# Patient Record
Sex: Male | Born: 1955 | Race: White | Hispanic: No | Marital: Married | Smoking: Never smoker
Health system: Southern US, Community
[De-identification: ages and names within clinical notes are randomized; demographics above are authoritative.]

## PROBLEM LIST (undated history)

## (undated) DIAGNOSIS — I10 Essential (primary) hypertension: Secondary | ICD-10-CM

## (undated) DIAGNOSIS — K8050 Calculus of bile duct without cholangitis or cholecystitis without obstruction: Secondary | ICD-10-CM

## (undated) HISTORY — PX: FOOT FOREIGN BODY REMOVAL: SUR1116

## (undated) HISTORY — DX: Calculus of bile duct without cholangitis or cholecystitis without obstruction: K80.50

---

## 2015-09-21 ENCOUNTER — Emergency Department (HOSPITAL_COMMUNITY): Payer: 59

## 2015-09-21 ENCOUNTER — Inpatient Hospital Stay (HOSPITAL_COMMUNITY)
Admission: EM | Admit: 2015-09-21 | Discharge: 2015-09-27 | DRG: 419 | Disposition: A | Discharge status: Home / Self Care | Payer: 59 | Attending: General Surgery | Admitting: General Surgery

## 2015-09-21 ENCOUNTER — Encounter (HOSPITAL_COMMUNITY): Payer: Self-pay | Admitting: *Deleted

## 2015-09-21 DIAGNOSIS — R1011 Right upper quadrant pain: Secondary | ICD-10-CM | POA: Diagnosis present

## 2015-09-21 DIAGNOSIS — K831 Obstruction of bile duct: Secondary | ICD-10-CM | POA: Insufficient documentation

## 2015-09-21 DIAGNOSIS — K838 Other specified diseases of biliary tract: Secondary | ICD-10-CM | POA: Diagnosis not present

## 2015-09-21 DIAGNOSIS — Z6837 Body mass index (BMI) 37.0-37.9, adult: Secondary | ICD-10-CM

## 2015-09-21 DIAGNOSIS — I10 Essential (primary) hypertension: Secondary | ICD-10-CM | POA: Diagnosis present

## 2015-09-21 DIAGNOSIS — R109 Unspecified abdominal pain: Secondary | ICD-10-CM | POA: Diagnosis present

## 2015-09-21 DIAGNOSIS — R1013 Epigastric pain: Secondary | ICD-10-CM | POA: Diagnosis not present

## 2015-09-21 DIAGNOSIS — R740 Nonspecific elevation of levels of transaminase and lactic acid dehydrogenase [LDH]: Secondary | ICD-10-CM

## 2015-09-21 DIAGNOSIS — R748 Abnormal levels of other serum enzymes: Secondary | ICD-10-CM | POA: Diagnosis not present

## 2015-09-21 DIAGNOSIS — R7989 Other specified abnormal findings of blood chemistry: Secondary | ICD-10-CM | POA: Diagnosis not present

## 2015-09-21 DIAGNOSIS — K219 Gastro-esophageal reflux disease without esophagitis: Secondary | ICD-10-CM | POA: Diagnosis present

## 2015-09-21 DIAGNOSIS — E669 Obesity, unspecified: Secondary | ICD-10-CM | POA: Diagnosis present

## 2015-09-21 DIAGNOSIS — Z8249 Family history of ischemic heart disease and other diseases of the circulatory system: Secondary | ICD-10-CM | POA: Diagnosis not present

## 2015-09-21 DIAGNOSIS — K8067 Calculus of gallbladder and bile duct with acute and chronic cholecystitis with obstruction: Secondary | ICD-10-CM | POA: Diagnosis present

## 2015-09-21 DIAGNOSIS — K8021 Calculus of gallbladder without cholecystitis with obstruction: Secondary | ICD-10-CM | POA: Insufficient documentation

## 2015-09-21 DIAGNOSIS — R739 Hyperglycemia, unspecified: Secondary | ICD-10-CM | POA: Diagnosis present

## 2015-09-21 DIAGNOSIS — K449 Diaphragmatic hernia without obstruction or gangrene: Secondary | ICD-10-CM | POA: Diagnosis present

## 2015-09-21 DIAGNOSIS — R945 Abnormal results of liver function studies: Secondary | ICD-10-CM

## 2015-09-21 DIAGNOSIS — K8050 Calculus of bile duct without cholangitis or cholecystitis without obstruction: Secondary | ICD-10-CM

## 2015-09-21 DIAGNOSIS — I160 Hypertensive urgency: Secondary | ICD-10-CM | POA: Diagnosis present

## 2015-09-21 DIAGNOSIS — R7401 Elevation of levels of liver transaminase levels: Secondary | ICD-10-CM

## 2015-09-21 HISTORY — DX: Essential (primary) hypertension: I10

## 2015-09-21 LAB — COMPREHENSIVE METABOLIC PANEL
ALT: 1867 U/L — AB (ref 17–63)
AST: 1316 U/L — AB (ref 15–41)
Albumin: 4.5 g/dL (ref 3.5–5.0)
Alkaline Phosphatase: 111 U/L (ref 38–126)
Anion gap: 8 (ref 5–15)
BUN: 15 mg/dL (ref 6–20)
CALCIUM: 9.2 mg/dL (ref 8.9–10.3)
CHLORIDE: 101 mmol/L (ref 101–111)
CO2: 28 mmol/L (ref 22–32)
CREATININE: 0.89 mg/dL (ref 0.61–1.24)
Glucose, Bld: 111 mg/dL — ABNORMAL HIGH (ref 65–99)
POTASSIUM: 4.3 mmol/L (ref 3.5–5.1)
SODIUM: 137 mmol/L (ref 135–145)
Total Bilirubin: 3.4 mg/dL — ABNORMAL HIGH (ref 0.3–1.2)
Total Protein: 8.1 g/dL (ref 6.5–8.1)

## 2015-09-21 LAB — CBC WITH DIFFERENTIAL/PLATELET
BASOS ABS: 0.1 10*3/uL (ref 0.0–0.1)
Basophils Relative: 1 %
EOS ABS: 0.4 10*3/uL (ref 0.0–0.7)
EOS PCT: 6 %
HCT: 51.5 % (ref 39.0–52.0)
Hemoglobin: 17.2 g/dL — ABNORMAL HIGH (ref 13.0–17.0)
Lymphocytes Relative: 14 %
Lymphs Abs: 1 10*3/uL (ref 0.7–4.0)
MCH: 30.4 pg (ref 26.0–34.0)
MCHC: 33.4 g/dL (ref 30.0–36.0)
MCV: 91.2 fL (ref 78.0–100.0)
MONO ABS: 0.7 10*3/uL (ref 0.1–1.0)
Monocytes Relative: 10 %
Neutro Abs: 4.9 10*3/uL (ref 1.7–7.7)
Neutrophils Relative %: 69 %
PLATELETS: 215 10*3/uL (ref 150–400)
RBC: 5.65 MIL/uL (ref 4.22–5.81)
RDW: 12.5 % (ref 11.5–15.5)
WBC: 7.1 10*3/uL (ref 4.0–10.5)

## 2015-09-21 LAB — URINE MICROSCOPIC-ADD ON
Bacteria, UA: NONE SEEN
Squamous Epithelial / LPF: NONE SEEN
WBC, UA: NONE SEEN WBC/hpf (ref 0–5)

## 2015-09-21 LAB — URINALYSIS, ROUTINE W REFLEX MICROSCOPIC
GLUCOSE, UA: NEGATIVE mg/dL
KETONES UR: NEGATIVE mg/dL
Leukocytes, UA: NEGATIVE
Nitrite: NEGATIVE
PROTEIN: NEGATIVE mg/dL
Specific Gravity, Urine: 1.01 (ref 1.005–1.030)
pH: 7 (ref 5.0–8.0)

## 2015-09-21 LAB — ETHANOL: Alcohol, Ethyl (B): <5 mg/dL (ref <5)

## 2015-09-21 LAB — TSH: TSH: 0.778 u[IU]/mL (ref 0.350–4.500)

## 2015-09-21 LAB — ACETAMINOPHEN LEVEL: Acetaminophen (Tylenol), Serum: <10 ug/mL — ABNORMAL LOW (ref 10–30)

## 2015-09-21 LAB — LIPASE, BLOOD: LIPASE: 27 U/L (ref 11–51)

## 2015-09-21 LAB — MAGNESIUM: Magnesium: 1.9 mg/dL (ref 1.7–2.4)

## 2015-09-21 LAB — TROPONIN I

## 2015-09-21 MED ORDER — IOHEXOL 300 MG/ML  SOLN
50.0000 mL | Freq: Once | INTRAMUSCULAR | Status: AC | PRN
  Administered 2015-09-21: 50 mL via ORAL

## 2015-09-21 MED ORDER — IOHEXOL 300 MG/ML  SOLN
100.0000 mL | Freq: Once | INTRAMUSCULAR | Status: AC | PRN
  Administered 2015-09-21: 100 mL via INTRAVENOUS

## 2015-09-21 MED ORDER — INFLUENZA VAC SPLIT QUAD 0.5 ML IM SUSY
0.5000 mL | PREFILLED_SYRINGE | INTRAMUSCULAR | Status: DC
  Filled 2015-09-21: qty 0.5

## 2015-09-21 MED ORDER — ACETAMINOPHEN 500 MG PO TABS
1000.0000 mg | ORAL_TABLET | Freq: Four times a day (QID) | ORAL | Status: DC | PRN
  Administered 2015-09-22 – 2015-09-23 (×2): 1000 mg via ORAL
  Filled 2015-09-21 (×3): qty 2

## 2015-09-21 MED ORDER — MORPHINE SULFATE (PF) 2 MG/ML IV SOLN: 2.0000 mg | INTRAVENOUS | Status: DC | PRN

## 2015-09-21 MED ORDER — ONDANSETRON HCL 4 MG/2ML IJ SOLN
4.0000 mg | Freq: Four times a day (QID) | INTRAMUSCULAR | Status: DC | PRN
  Administered 2015-09-22: 4 mg via INTRAVENOUS
  Filled 2015-09-21: qty 2

## 2015-09-21 MED ORDER — HEPARIN SODIUM (PORCINE) 5000 UNIT/ML IJ SOLN
5000.0000 [IU] | Freq: Three times a day (TID) | INTRAMUSCULAR | Status: DC
Start: 2015-09-21 — End: 2015-09-22
  Filled 2015-09-21 (×2): qty 1

## 2015-09-21 MED ORDER — ONDANSETRON HCL 4 MG PO TABS: 4.0000 mg | ORAL_TABLET | Freq: Four times a day (QID) | ORAL | Status: DC | PRN

## 2015-09-21 MED ORDER — PANTOPRAZOLE SODIUM 40 MG IV SOLR
40.0000 mg | INTRAVENOUS | Status: DC
  Administered 2015-09-21 – 2015-09-25 (×5): 40 mg via INTRAVENOUS
  Filled 2015-09-21 (×6): qty 40

## 2015-09-21 MED ORDER — DIATRIZOATE MEGLUMINE & SODIUM 66-10 % PO SOLN
ORAL | Status: AC
  Filled 2015-09-21: qty 30

## 2015-09-21 MED ORDER — SODIUM CHLORIDE 0.9 % IV SOLN
INTRAVENOUS | Status: DC
  Administered 2015-09-21 – 2015-09-26 (×4): via INTRAVENOUS

## 2015-09-21 MED ORDER — HYDRALAZINE HCL 20 MG/ML IJ SOLN
10.0000 mg | Freq: Four times a day (QID) | INTRAMUSCULAR | Status: DC | PRN
  Administered 2015-09-21 – 2015-09-25 (×2): 10 mg via INTRAVENOUS
  Filled 2015-09-21 (×3): qty 1

## 2015-09-21 MED ORDER — GI COCKTAIL ~~LOC~~
30.0000 mL | Freq: Once | ORAL | Status: AC
  Administered 2015-09-21: 30 mL via ORAL
  Filled 2015-09-21: qty 30

## 2015-09-21 MED ORDER — LABETALOL HCL 5 MG/ML IV SOLN
20.0000 mg | Freq: Once | INTRAVENOUS | Status: AC
  Administered 2015-09-21: 20 mg via INTRAVENOUS
  Filled 2015-09-21: qty 4

## 2015-09-21 MED ORDER — SODIUM CHLORIDE 0.9 % IV SOLN
INTRAVENOUS | Status: AC
  Administered 2015-09-21: 16:00:00 via INTRAVENOUS

## 2015-09-21 MED ORDER — CARVEDILOL 12.5 MG PO TABS
12.5000 mg | ORAL_TABLET | Freq: Two times a day (BID) | ORAL | Status: DC
  Administered 2015-09-21 – 2015-09-27 (×10): 12.5 mg via ORAL
  Filled 2015-09-21 (×11): qty 1

## 2015-09-21 NOTE — ED Notes (Signed)
Pt went to [facility] today for upper abd pain, and was sent here for work up of pain and hypertension- [facility].

## 2015-09-21 NOTE — H&P (Signed)
[facility] History and Physical  Joe Kidd ZOX:[id number] DOB: 1956-02-04 DOA: 09/21/2015  Referring physician: Dr. Manus Gunning PCP: Colette Ribas, MD   Chief Complaint: Abdominal pain, indigestion and high blood pressure.  HPI: Joe Kidd is a 60 y.o. male with a past medical history significant for reflux and obesity; who presented to the emergency department secondary to abdominal pain, indigestion and elevated blood pressure. Patient reports eating some hot dogs at home the night prior to admission with a subsequent indigestion symptoms and abdominal pain. Patient was localizing his epigastric area and right upper quadrant; no relieving factors, no aggravating factors, mild nausea, no vomiting; pain was not radiated to any other place and didn't have any further associated symptoms. On the morning of admission patient decided to go to the urgent care since he continued to have some discomfort in his abdomen. At the urgent care patient was found with severely elevated high blood pressure (systolic blood pressure in the 190s range and diastolic blood pressure in the 115s range), visible he was transferred to the emergency department for further evaluation and treatment. Once in the ED patient workup demonstrated distended gallbladder on CT scan, transaminitis (AST and ALT in 1300 and 1800 range respectively). Patient has a normal lipase and otherwise unremarkable blood work. He was afebrile with normal WBCs. Surgery was consulted given findings of his gallbladder, and  Recommended admission for further evaluation and treatment from GI given transaminitis and potential needs of ERCP. [facility] has been consulted to admit the patient for further evaluation and treatment. GI service was consulted as well.  Review of Systems:  Negative except as otherwise mentioned in history of present illness.  History reviewed. No pertinent past medical history.   History reviewed. No pertinent  past surgical history.   Social History:  reports that he has never smoked. He does not have any smokeless tobacco history on file. He reports that he does not drink alcohol. His drug history is not on file.  Allergies  Allergen Reactions  . 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 Asa [Aspirin]     Steven-Johnson syndrome and tachycardia    Family history: Significant just for hypertension in father side) (  Prior to Admission medications   Medication Sig Start Date End Date Taking? Authorizing Provider  acetaminophen (TYLENOL) 500 MG tablet Take 1,000 mg by mouth every 6 (six) hours as needed for mild pain.   Yes Historical Provider, MD  calcium carbonate (TUMS - DOSED IN MG ELEMENTAL CALCIUM) 500 MG chewable tablet Chew 1 tablet by mouth 2 (two) times daily as needed for indigestion or heartburn.   Yes Historical Provider, MD   Physical Exam: Filed Vitals:   09/21/15 1400 09/21/15 1500 09/21/15 1530 09/21/15 1631  BP: 153/105 133/95 146/95 173/103  Pulse: 84 82 83 80  Temp:    98.2 F (36.8 C)  TempSrc:    Oral  Resp: Height:     (1.854 m)  Weight:      SpO2: 96% 95% 97% 95%    Wt Readings from Last 3 Encounters:  09/21/15 128.822 kg (284 lb)    General:  Appears calm and comfortable at this moment. Reports significant improvement after pain medications on his abdominal discomfort. Denies fever, chest pain and shortness of breath. Eyes: PERRL, normal lids, irises & conjunctiva, no icterus, no nystagmus ENT: grossly normal hearing, moist mucous  membranes, no erythema, no exudates, no drainage out of his ears or nostrils. Neck: no LAD, masses or thyromegaly Cardiovascular: RRR, no m/r/g. No JVD Respiratory: CTA bilaterally, no w/r/r. Normal respiratory effort. Abdomen: soft, no guarding, mild right upper quadrant pain with deep palpation, also with some epigastric vague discomfort. No distention,  positive bowel sounds Skin: no rash, open wounds or induration seen on limited exam Musculoskeletal: grossly normal tone BUE/BLE, trace edema bilaterally Psychiatric: grossly normal mood and affect, speech fluent and appropriate Neurologic: grossly non-focal.          Labs on Admission:  Basic Metabolic Panel:  Recent Labs Lab 09/21/15 1137  NA 137  K 4.3  CL 101  CO2 28  GLUCOSE 111*  BUN 15  CREATININE 0.89  CALCIUM 9.2   Liver Function Tests:  Recent Labs Lab 09/21/15 1137  AST 1316*  ALT 1867*  ALKPHOS 111  BILITOT 3.4*  PROT 8.1  ALBUMIN 4.5    Recent Labs Lab 09/21/15 1137  LIPASE 27   CBC:  Recent Labs Lab 09/21/15 1137  WBC 7.1  NEUTROABS 4.9  HGB 17.2*  HCT 51.5  MCV 91.2  PLT 215   Cardiac Enzymes:  Recent Labs Lab 09/21/15 1137  TROPONINI <0.03   Radiological Exams on Admission: Ct Abdomen Pelvis W Contrast  09/21/2015  CLINICAL DATA:  Intermittent upper abdominal pain since last night. EXAM: CT ABDOMEN AND PELVIS WITH CONTRAST TECHNIQUE: Multidetector CT imaging of the abdomen and pelvis was performed using the standard protocol following bolus administration of intravenous contrast. CONTRAST:  OMNIPAQUE IOHEXOL 300 MG/ML SOLN, 50mL OMNIPAQUE IOHEXOL 300 MG/ML SOLN COMPARISON:  None. FINDINGS: Mild atelectasis in the right lung base. Lung bases are otherwise within normal limits. No free air or fluid. The gallbladder is distended with mild increased attenuation in the fat adjacent to the neck of the gallbladder and adjacent bile ducts. No filling defects are seen within the bile ducts. There is mild prominence of the intrahepatic bile ducts as well. The liver and portal vein are otherwise within normal limits. The spleen, adrenal glands, pancreas, and kidneys are within normal limits. No aneurysm or adenopathy identified within the abdomen. The stomach and small bowel are normal. The colon demonstrates scattered diverticuli with no  diverticulitis. The appendix is well seen with no evidence of appendicitis. The pelvis demonstrates no adenopathy or mass. The bladder, prostate, and seminal vesicles are normal. No acute bony abnormality. IMPRESSION: There is distention of the gallbladder with mild stranding adjacent to the neck of the gallbladder and adjacent bile ducts. Mild intra hepatic biliary duct prominence. These findings are likely related to the patient's pain. An ultrasound could better evaluate the gallbladder. If there is concern for biliary obstruction after ultrasound and/or labs, an MRCP could better evaluate the biliary tree. Electronically Signed   By: Gerome Sam III M.D   On: 09/21/2015 13:09    EKG:  Nonspecific T-wave abnormalities, no acute ischemic changes, Sinus rhythm and regular rate. Left ventricular hypertrophy appreciated.  Assessment/Plan 1-Abdominal pain: Epigastric area and right upper quadrant -CT scan demonstrating choledocholithiasis -Patient also with transaminitis but no signs of cholangitis -Admitted for fluid resuscitation, antiemetics, analgesics and further evaluation and treatment. -GI and general surgery has been consulted -Right upper quadrant ultrasound will be performed; to guide into further needs -Patient without fever, normal WBCs and no icterus on exam. -Will hold on antibiotics for now -Normal lipase -follow LFT's in am  2-Choledocholithiasis: -Will eventually need cholecystectomy  3-Essential hypertension: Newly diagnosed with Hypertensive urgency On admission -Most likely associated with ongoing pain on top of underlying unknown hypertension -Patient will be started on Coreg 12.5 mg twice a day -Also as needed hydralazine -Pain medication as mentioned in problem #1  4-Transaminitis: Most likely associated with choledocholithiasis -Hepatitis panel has been order -Will provide supportive care and follow LFTs trend   5-GERD (gastroesophageal reflux disease) -Will  use PPI  6-Obesity: -Low calorie diet and exercise has been discussed with patient -Body mass index is 37.48 kg/(m^2).   7-hyperglycemia: Without history of diabetes -Will check hemoglobin A1c -Will repeat fasting glucose in a.m.   GI (Dr. Jena Gauss) General surgery   Code Status: Full code DVT Prophylaxis: Heparin Family Communication: Wife at bedside Disposition Plan: LOS > 2 midnights; inpatient, med-surg bed  Time spent: 70 minutes  Vassie Loll [facility] Pager [phone]

## 2015-09-21 NOTE — Progress Notes (Signed)
Dr. Gwenlyn Perking notified via text page of patient's arrival to room 305.

## 2015-09-21 NOTE — Progress Notes (Signed)
Referring Provider: Dr. Manus Gunning Primary Care Physician:  Colette Ribas, MD Primary Gastroenterologist:  Dr. Jena Gauss  Reason for Consultation:  Elevated LFTs; distended gallbladder   HPI:   Very pleasant 60 year old male [place] resident resident referred from the [place] urgent care center in [place] today to the [facility] emergency department for markedly elevated blood pressure. He presented down there for further evaluation of epigastric discomfort he describes as "indigestion". States he started having symptoms after he ate a hot dog last night. Waxing and waning in nature. At [facility], he was indeed found to have a markedly elevated blood pressure. Moreover, he had significantly elevated aminotransferases greater than 1000. Bilirubin greater than 3. CT of the abdomen was done which revealed mild intrahepatic biliary dilation and a distended gallbladder with some stranding around the neck of the gallbladder. The liver parenchyma appeared normal on CT scan. No Common duct stone seen. No mass in the pancreas.  Interestingly, white count was normal. Serum lipase also normal. Patient states after a single dose of GI cocktail his pain resolved. Currently, he states discomfort is not even a 1 on a scale of 1-10.  No prior GI issues. No prior screening colonoscopy. No family history of colorectal neoplasia or other chronic gastrointestinal illness. He denies typical reflux symptoms. Denies dysphagia, odynophagia, melena or rectal bleeding. Denies weight loss.  Patient states he sees [employer] in frequently for primary care needs. Patient denies tobacco, alcohol or nonsteroidals.  Rare acetaminophen use.   History reviewed. No pertinent past medical history.  History reviewed. No pertinent past surgical history.  Prior to Admission medications   Medication Sig Start Date End Date Taking? Authorizing Provider  acetaminophen (TYLENOL) 500 MG tablet Take 1,000 mg by  mouth every 6 (six) hours as needed for mild pain.   Yes Historical Provider, MD  calcium carbonate (TUMS - DOSED IN MG ELEMENTAL CALCIUM) 500 MG chewable tablet Chew 1 tablet by mouth 2 (two) times daily as needed for indigestion or heartburn.   Yes Historical Provider, MD    Current Facility-Administered Medications  Medication Dose Route Frequency Provider Last Rate Last Dose  . 0.9 %  sodium chloride infusion   Intravenous STAT Glynn Octave, MD 100 mL/hr at 09/21/15 1534    . diatrizoate meglumine-sodium (GASTROGRAFIN) 66-10 % solution           . [START ON 09/22/2015] Influenza vac split quadrivalent PF (FLUARIX) injection 0.5 mL  0.5 mL Intramuscular Tomorrow-1000 Vassie Loll, MD      . pantoprazole (PROTONIX) injection 40 mg  40 mg Intravenous Q24H Corbin Ade, MD        Allergies as of 09/21/2015 - Review Complete 09/21/2015  Allergen Reaction Noted  . Advil [ibuprofen]  09/21/2015  . Asa [aspirin]  09/21/2015    History reviewed. No pertinent family history.  Social History   Social History  . Marital Status: Married    Spouse Name: N/A  . Number of Children: N/A  . Years of Education: N/A   Occupational History  . Not on file.   Social History Main Topics  . Smoking status: Never Smoker   . Smokeless tobacco: Not on file  . Alcohol Use: No  . Drug Use: Not on file  . Sexual Activity: Not on file   Other Topics Concern  . Not on file   Social History Narrative  . No narrative on file    Review of Systems: Gen: Denies any fever, chills,  sweats, anorexia, fatigue, weakness, malaise, weight loss, and sleep disorder CV: Denies chest pain, angina, palpitations, syncope, orthopnea, PND, peripheral edema, and claudication. Resp: Denies dyspnea at rest, dyspnea with exercise, cough, sputum, wheezing, coughing up blood, and pleurisy. GI: Denies vomiting blood, jaundice, and fecal incontinence.   Denies dysphagia or odynophagia. Derm: Denies rash, itching, dry  skin, hives, moles, warts, or unhealing ulcers.   Physical Exam: Vital signs in last 24 hours: Temp:  [98.2 F (36.8 C)-98.3 F (36.8 C)] 98.2 F (36.8 C) (02/25 1631) Pulse Rate:  [80-95] 80 (02/25 1631) Resp:  [16-30] 18 (02/25 1631) BP: (133-191)/(95-131) 173/103 mmHg (02/25 1631) SpO2:  [95 %-100 %] 95 % (02/25 1631) Weight:  [284 lb (128.822 kg)] 284 lb (128.822 kg) (02/25 1112) Last BM Date: 09/21/15 General:   Alert,  Well-developed, well-nourished, obese, pleasant and cooperative in NAD Head:  Normocephalic and atraumatic. Lungs:  Clear throughout to auscultation.   No wheezes, crackles, or rhonchi. No acute distress. Heart:  Regular rate and rhythm; no murmurs, clicks, rubs,  or gallops. Abdomen:   Obese. Nondistended. Positive bowel sounds. Soft and nontender. Tender to palpation. No appreciable mass or organomegaly.    Intake/Output from previous day:   Intake/Output this shift:    Lab Results:  Recent Labs  09/21/15 1137  WBC 7.1  HGB 17.2*  HCT 51.5  PLT 215   BMET  Recent Labs  09/21/15 1137  NA 137  K 4.3  CL 101  CO2 28  GLUCOSE 111*  BUN 15  CREATININE 0.89  CALCIUM 9.2   LFT  Recent Labs  09/21/15 1137  PROT 8.1  ALBUMIN 4.5  AST 1316*  ALT 1867*  ALKPHOS 111  BILITOT 3.4*    Studies/Results: Ct Abdomen Pelvis W Contrast  09/21/2015  CLINICAL DATA:  Intermittent upper abdominal pain since last night. EXAM: CT ABDOMEN AND PELVIS WITH CONTRAST TECHNIQUE: Multidetector CT imaging of the abdomen and pelvis was performed using the standard protocol following bolus administration of intravenous contrast. CONTRAST:  OMNIPAQUE IOHEXOL 300 MG/ML SOLN, 50mL OMNIPAQUE IOHEXOL 300 MG/ML SOLN COMPARISON:  None. FINDINGS: Mild atelectasis in the right lung base. Lung bases are otherwise within normal limits. No free air or fluid. The gallbladder is distended with mild increased attenuation in the fat adjacent to the neck of the gallbladder and  adjacent bile ducts. No filling defects are seen within the bile ducts. There is mild prominence of the intrahepatic bile ducts as well. The liver and portal vein are otherwise within normal limits. The spleen, adrenal glands, pancreas, and kidneys are within normal limits. No aneurysm or adenopathy identified within the abdomen. The stomach and small bowel are normal. The colon demonstrates scattered diverticuli with no diverticulitis. The appendix is well seen with no evidence of appendicitis. The pelvis demonstrates no adenopathy or mass. The bladder, prostate, and seminal vesicles are normal. No acute bony abnormality. IMPRESSION: There is distention of the gallbladder with mild stranding adjacent to the neck of the gallbladder and adjacent bile ducts. Mild intra hepatic biliary duct prominence. These findings are likely related to the patient's pain. An ultrasound could better evaluate the gallbladder. If there is concern for biliary obstruction after ultrasound and/or labs, an MRCP could better evaluate the biliary tree. Electronically Signed   By: Gerome Sam III M.D   On: 09/21/2015 13:09    Impression: Pleasant 60 year old gentleman presents with a nearly 24-hour hair. History of "indigestion" which he further characterizes vague epigastric pain. Sent  to the ED from urgent care actually for a markedly elevated blood pressure.  He derivived significant relief in his symptoms with a GI cocktail.  He has markedly elevated aminotransferases with an elevated bilirubin.  I personally reviewed the  CT images with Dr. Charlett Nose this afternoon. He concurs with the initial interpretation the patient does have very mild intrahepatic biliary dilation and a significantly distended gallbladder. Some stranding towards the neck. No evidence of choledocholithiasis. The common bile duct smoothly tapers and is nondilated distally. No pancreatic mass. Liver parenchyma looks okay.   This gentleman currently does  not look at all acutely ill or toxic.  Patient states mild pain he was experiencing was most improved with GI cocktail.  I am suspicious that his acute illness may be biliary in origin. However, further investigation warranted. Suppose he could have an early Mirizzi syndrome versus other early obstructing process.  However, there is no indication for an ERCP urgent or otherwise at this time. Recommendations could change in the near future.  He may have coexisting GERD.  We can get more information non- invasively Regarding the presence of stones, etc. with ultrasonography.   Recommendations:  Allow a clear liquid diet. Add PPI empirically. Repeat hepatic function profile tomorrow morning.  Agree with hepatitis panel for completion of evaluation.  Also, proceed with a transabdominal / right upper quadrant ultrasound tomorrow morning.  Further recommendations to follow.   I'd like to to thank Dr. Manus Gunning for allowing me to see this nice gentleman this evening         Notice:  This dictation was prepared with Dragon dictation along with smaller phrase technology. Any transcriptional errors that result from this process are unintentional and may not be corrected upon review.

## 2015-09-21 NOTE — ED Provider Notes (Signed)
CSN: [id number]     Arrival date & time 09/21/15  1058 History  By signing my name below, I, Bethel Born, attest that this documentation has been prepared under the direction and in the presence of Glynn Octave, MD. Electronically Signed: Bethel Born, ED Scribe. 09/21/2015. 12:07 PM   Chief Complaint  Patient presents with  . Hypertension    The history is provided by the patient. No language interpreter was used.   Joe Kidd is a 60 y.o. male who presents to the Emergency Department complaining of constant, dull, non radiating, upper abdominal pain with onset last night after eating hot dogs. This pain is more severe than pain that he has had in the past with indigestion.  The pain is improved with sitting upright and worse with laying flat. Tums provided insufficient relief at home. He has had a cough over the last several days.  He was seen at Urgent Care for his abdominal pain today and referred to the ED for HTN. Pt denies headache, dizziness, vision change, chest pain, SOB, n/v/d. Denies frequent alcohol use and frequent NSAID use. No history of ulcers. No history of abdominal surgery. He has never had a stress test. No daily medication.   History reviewed. No pertinent past medical history. History reviewed. No pertinent past surgical history. History reviewed. No pertinent family history. Social History  Substance Use Topics  . Smoking status: Never Smoker   . Smokeless tobacco: None  . Alcohol Use: No    Review of Systems  10 Systems reviewed and all are negative for acute change except as noted in the HPI.  Allergies  Advil and Asa  Home Medications   Prior to Admission medications   Medication Sig Start Date End Date Taking? Authorizing Provider  acetaminophen (TYLENOL) 500 MG tablet Take 1,000 mg by mouth every 6 (six) hours as needed for mild pain.   Yes Historical Provider, MD  calcium carbonate (TUMS - DOSED IN MG ELEMENTAL CALCIUM) 500 MG chewable  tablet Chew 1 tablet by mouth 2 (two) times daily as needed for indigestion or heartburn.   Yes Historical Provider, MD   BP 191/124 mmHg  Pulse 92  Temp(Src) 98.3 F (36.8 C) (Temporal)  Resp 16  Ht 6\' 1"  (1.854 m)  Wt 284 lb (128.822 kg)  BMI 37.48 kg/m2  SpO2 100% Physical Exam  Constitutional: He is oriented to person, place, and time. He appears well-developed and well-nourished. No distress.  Well appearing  HENT:  Head: Normocephalic and atraumatic.  Mouth/Throat: Oropharynx is clear and moist. No oropharyngeal exudate.  Eyes: Conjunctivae and EOM are normal. Pupils are equal, round, and reactive to light.  Neck: Normal range of motion. Neck supple.  No meningismus.  Cardiovascular: Normal rate, regular rhythm, normal heart sounds and intact distal pulses.   No murmur heard. Pulmonary/Chest: Effort normal and breath sounds normal. No respiratory distress.  Abdominal: Soft. There is tenderness in the epigastric area. There is no rebound and no guarding.  No RUQ pain  Musculoskeletal: Normal range of motion. He exhibits no edema or tenderness.  Neurological: He is alert and oriented to person, place, and time. No cranial nerve deficit. He exhibits normal muscle tone. Coordination normal.  No ataxia on finger to nose bilaterally. No pronator drift. 5/5 strength throughout. CN 2-12 intact.Equal grip strength. Sensation intact.   Skin: Skin is warm.  Psychiatric: He has a normal mood and affect. His behavior is normal.  Nursing note and vitals reviewed.   ED  Course  Procedures (including critical care time) DIAGNOSTIC STUDIES: Oxygen Saturation is 100% on RA,  normal by my interpretation.    COORDINATION OF CARE: 12:05 PM Discussed treatment plan which includes lab work and EKG with pt at bedside and pt agreed to plan.  Labs Review Labs Reviewed  CBC WITH DIFFERENTIAL/PLATELET - Abnormal; Notable for the following:    Hemoglobin 17.2 (*)    All other components within  normal limits  COMPREHENSIVE METABOLIC PANEL - Abnormal; Notable for the following:    Glucose, Bld 111 (*)    AST 1316 (*)    ALT 1867 (*)    Total Bilirubin 3.4 (*)    All other components within normal limits  URINALYSIS, ROUTINE W REFLEX MICROSCOPIC (NOT AT [facility]) - Abnormal; Notable for the following:    Hgb urine dipstick TRACE (*)    Bilirubin Urine SMALL (*)    All other components within normal limits  ACETAMINOPHEN LEVEL - Abnormal; Notable for the following:    Acetaminophen (Tylenol), Serum <10 (*)    All other components within normal limits  LIPASE, BLOOD  TROPONIN I  ETHANOL  URINE MICROSCOPIC-ADD ON  HEPATITIS PANEL, ACUTE    Imaging Review Ct Abdomen Pelvis W Contrast  09/21/2015  CLINICAL DATA:  Intermittent upper abdominal pain since last night. EXAM: CT ABDOMEN AND PELVIS WITH CONTRAST TECHNIQUE: Multidetector CT imaging of the abdomen and pelvis was performed using the standard protocol following bolus administration of intravenous contrast. CONTRAST:  OMNIPAQUE IOHEXOL 300 MG/ML SOLN, 50mL OMNIPAQUE IOHEXOL 300 MG/ML SOLN COMPARISON:  None. FINDINGS: Mild atelectasis in the right lung base. Lung bases are otherwise within normal limits. No free air or fluid. The gallbladder is distended with mild increased attenuation in the fat adjacent to the neck of the gallbladder and adjacent bile ducts. No filling defects are seen within the bile ducts. There is mild prominence of the intrahepatic bile ducts as well. The liver and portal vein are otherwise within normal limits. The spleen, adrenal glands, pancreas, and kidneys are within normal limits. No aneurysm or adenopathy identified within the abdomen. The stomach and small bowel are normal. The colon demonstrates scattered diverticuli with no diverticulitis. The appendix is well seen with no evidence of appendicitis. The pelvis demonstrates no adenopathy or mass. The bladder, prostate, and seminal vesicles are normal. No  acute bony abnormality. IMPRESSION: There is distention of the gallbladder with mild stranding adjacent to the neck of the gallbladder and adjacent bile ducts. Mild intra hepatic biliary duct prominence. These findings are likely related to the patient's pain. An ultrasound could better evaluate the gallbladder. If there is concern for biliary obstruction after ultrasound and/or labs, an MRCP could better evaluate the biliary tree. Electronically Signed   By: Gerome Sam III M.D   On: 09/21/2015 13:09   I have personally reviewed and evaluated these lab results as part of my medical decision-making.   EKG Interpretation   Date/Time:  Saturday September 21 2015 11:29:33 EST Ventricular Rate:  92 PR Interval:  158 QRS Duration: 102 QT Interval:  352 QTC Calculation: 435 R Axis:   27 Text Interpretation:  Sinus rhythm Left ventricular hypertrophy  Nonspecific T abnormalities, inferior leads No previous ECGs available  Confirmed by Manus Gunning  MD, Witten Certain [phone]) on 09/21/2015 12:13:01 PM      MDM   Final diagnoses:  Choledocholithiasis  Hypertensive urgency   Epigastric pain since last night. Sent from urgent care with elevated blood pressure. No history  of hypertension. No chest pain or shortness of breath.  EKG shows LVH. Transaminitis noted. Lipase normal. Troponin negative.  CT scan shows distention of the gallbladder with some stranding of the neck. Duct prominence. Total bilirubin 3.4 with normal lipase.  Patient will need admission for GI and surgery evaluation and probably ERCP. Labetalol given for hypertension. APAP negative. Hepatitis panel sent.  Discussed with Dr. Lovell Sheehan who will consult on patient.  D/w Dr. Gwenlyn Perking. He wishes to confirm that ERCP/MRCP could be done this weekend at [facility].  D/w Dr. Jena Gauss who states he could perform ERCP and feels patient should stay at [facility].  No fever or leukocytosis. No evidence of cholangitis. Patient given labetalol for his elevated blood  pressure. He is a asymptomatic regarding his blood pressure.  Dr. Gwenlyn Perking updated regarding patient's admission.   I personally performed the services described in this documentation, which was scribed in my presence. The recorded information has been reviewed and is accurate.    Glynn Octave, MD 09/21/15 [phone]

## 2015-09-21 NOTE — ED Notes (Signed)
Pt c/o intermittent upper abd pain since last night. Pt seen at urgent care today and sent to Ed for upper abd pain and elevated bp. Pt has no history of htn. Denies cp/sob/n/v. Denies ha/numbness/weakness/blurred vision. nad noted.

## 2015-09-22 ENCOUNTER — Inpatient Hospital Stay (HOSPITAL_COMMUNITY): Payer: 59

## 2015-09-22 DIAGNOSIS — K8050 Calculus of bile duct without cholangitis or cholecystitis without obstruction: Secondary | ICD-10-CM

## 2015-09-22 DIAGNOSIS — R748 Abnormal levels of other serum enzymes: Secondary | ICD-10-CM | POA: Insufficient documentation

## 2015-09-22 LAB — CBC
HEMATOCRIT: 47.5 % (ref 39.0–52.0)
HEMOGLOBIN: 15.6 g/dL (ref 13.0–17.0)
MCH: 30 pg (ref 26.0–34.0)
MCHC: 32.8 g/dL (ref 30.0–36.0)
MCV: 91.3 fL (ref 78.0–100.0)
Platelets: 208 10*3/uL (ref 150–400)
RBC: 5.2 MIL/uL (ref 4.22–5.81)
RDW: 12.8 % (ref 11.5–15.5)
WBC: 6.2 10*3/uL (ref 4.0–10.5)

## 2015-09-22 LAB — HEPATIC FUNCTION PANEL
ALK PHOS: 145 U/L — AB (ref 38–126)
ALT: 1433 U/L — AB (ref 17–63)
AST: 510 U/L — ABNORMAL HIGH (ref 15–41)
Albumin: 3.7 g/dL (ref 3.5–5.0)
BILIRUBIN DIRECT: 4.6 mg/dL — AB (ref 0.1–0.5)
BILIRUBIN TOTAL: 7.8 mg/dL — AB (ref 0.3–1.2)
Indirect Bilirubin: 3.2 mg/dL — ABNORMAL HIGH (ref 0.3–0.9)
Total Protein: 6.9 g/dL (ref 6.5–8.1)

## 2015-09-22 LAB — APTT: aPTT: 26 seconds (ref 24–37)

## 2015-09-22 LAB — PROTIME-INR
INR: 1.11 (ref 0.00–1.49)
PROTHROMBIN TIME: 14.5 s (ref 11.6–15.2)

## 2015-09-22 LAB — HEPATITIS PANEL, ACUTE
HCV Ab: <0.1 s/co ratio (ref 0.0–0.9)
HEP A IGM: NEGATIVE
HEP B C IGM: NEGATIVE
Hepatitis B Surface Ag: NEGATIVE

## 2015-09-22 NOTE — Progress Notes (Addendum)
[facility] PROGRESS NOTE  Joe Kidd MWU:[id number] DOB: 1956/05/17 DOA: 09/21/2015 PCP: Colette Ribas, MD  Assessment/Plan: 1-Abdominal pain: Epigastric area and right upper quadrant -CT scan demonstrating choledocholithiasis; Patient also with transaminitis but no signs of cholangitis -LFT's trending down -Bilirubin is up -GI and general surgery on board; will follow rec's -Right upper quadrant ultrasound as reported below. Will proceed with MRCP vs ERCP in am, depending bloodwork -Patient without fever, normal WBCs; today mild icterus/slight jaundice on exam. -Will continue holding on antibiotics for now -follow LFT's in am  2-Choledocholithiasis: -Will eventually need cholecystectomy -GI is on board -waiting condition to cool off -LFT's trending down; but bilirubin is up.  3-Essential hypertension: Newly diagnosed; with Hypertensive urgency On admission. -Most likely associated with ongoing pain on top of underlying unknown hypertension -started on Coreg 12.5 mg twice a day -PRN hydralazine -Pain medication as mentioned in problem #1 -follow response and adjust antihypertensives as needed   4-Transaminitis: Most likely associated with choledocholithiasis -Hepatitis panel negative -Will continue supportive care and follow LFTs trend -MRCP in am and will follow GI rec's  5-GERD (gastroesophageal reflux disease) -Will continue use PPI  6-Obesity: -Low calorie diet and exercise has been discussed with patient -Body mass index is 37.48 kg/(m^2).   7-hyperglycemia: Without history of diabetes -Will follow A1C -repeat fasting glucose in a.m. 111  Code Status: Full Family Communication: wife and mother at bedside  Disposition Plan: remains inpatient; continue supportive care; follow GI rec's and MRCP results. Will most likely need cholecystectomy during this admission.    Consultants:  GI  General surgery   Procedures:  Right upper quadrant  ultrasound: Demonstrated occult bladder dissection with gallstone retained in the neck of the gallbladder. There is also common bile duct dilatation at 10 mm.  Antibiotics:  None  HPI/Subjective: Patient is overall feeling better, denies abdominal pain, nausea, vomiting, chest pain or shortness of breath. He is afebrile. Except for slight jaundice/icterus; no other acute complaints. Of note patient has refused heparin for DVT prophylaxis. Prefers to increase ambulation and okay for SCDs while on bed.  Objective: Filed Vitals:   09/21/15 2247 09/22/15 0500  BP: 204/120 133/89  Pulse: 94 96  Temp: 98.8 F (37.1 C) 97.7 F (36.5 C)  Resp: 20 18   No intake or output data in the 24 hours ending 09/22/15 1137 Filed Weights   09/21/15 1112  Weight: 128.822 kg (284 lb)    Exam:   General:  Afebrile, denies chest pain, denies shortness of breath; slight jaundice/icterus appreciated. No nausea, no vomiting, no abdominal pain.  Cardiovascular: S1 and S2, no rubs, no gallops  Respiratory: Clear to auscultation bilaterally; no use of accessory muscles.  Abdomen: Obese, Soft, no guarding, positive bowel sounds, no distention  Musculoskeletal: Trace of edema bilaterally, no cyanosis.  Data Reviewed: Basic Metabolic Panel:  Recent Labs Lab 09/21/15 1137 09/21/15 2051  NA 137  --   K 4.3  --   CL 101  --   CO2 28  --   GLUCOSE 111*  --   BUN 15  --   CREATININE 0.89  --   CALCIUM 9.2  --   MG  --  1.9   Liver Function Tests:  Recent Labs Lab 09/21/15 1137 09/22/15 0616  AST 1316* 510*  ALT 1867* 1433*  ALKPHOS 111 145*  BILITOT 3.4* 7.8*  PROT 8.1 6.9  ALBUMIN 4.5 3.7    Recent Labs Lab 09/21/15 1137  LIPASE 27  CBC:  Recent Labs Lab 09/21/15 1137 09/22/15 0616  WBC 7.1 6.2  NEUTROABS 4.9  --   HGB 17.2* 15.6  HCT 51.5 47.5  MCV 91.2 91.3  PLT 215 208   Cardiac Enzymes:  Recent Labs Lab 09/21/15 1137  TROPONINI <0.03   Studies: Ct  Abdomen Pelvis W Contrast  09/21/2015  CLINICAL DATA:  Intermittent upper abdominal pain since last night. EXAM: CT ABDOMEN AND PELVIS WITH CONTRAST TECHNIQUE: Multidetector CT imaging of the abdomen and pelvis was performed using the standard protocol following bolus administration of intravenous contrast. CONTRAST:  OMNIPAQUE IOHEXOL 300 MG/ML SOLN, 50mL OMNIPAQUE IOHEXOL 300 MG/ML SOLN COMPARISON:  None. FINDINGS: Mild atelectasis in the right lung base. Lung bases are otherwise within normal limits. No free air or fluid. The gallbladder is distended with mild increased attenuation in the fat adjacent to the neck of the gallbladder and adjacent bile ducts. No filling defects are seen within the bile ducts. There is mild prominence of the intrahepatic bile ducts as well. The liver and portal vein are otherwise within normal limits. The spleen, adrenal glands, pancreas, and kidneys are within normal limits. No aneurysm or adenopathy identified within the abdomen. The stomach and small bowel are normal. The colon demonstrates scattered diverticuli with no diverticulitis. The appendix is well seen with no evidence of appendicitis. The pelvis demonstrates no adenopathy or mass. The bladder, prostate, and seminal vesicles are normal. No acute bony abnormality. IMPRESSION: There is distention of the gallbladder with mild stranding adjacent to the neck of the gallbladder and adjacent bile ducts. Mild intra hepatic biliary duct prominence. These findings are likely related to the patient's pain. An ultrasound could better evaluate the gallbladder. If there is concern for biliary obstruction after ultrasound and/or labs, an MRCP could better evaluate the biliary tree. Electronically Signed   By: Gerome Sam III M.D   On: 09/21/2015 13:09   US Abdomen Limited Ruq  09/22/2015  CLINICAL DATA:  Right upper quadrant epigastric pain for 2 days. Distended gallbladder and ducts on CT. EXAM: US ABDOMEN LIMITED - RIGHT  UPPER QUADRANT COMPARISON:  CT 09/21/2015 FINDINGS: Gallbladder: The gallbladder is distended. There is a small echogenic focus within the gallbladder neck measuring 12 mm. No wall thickening. Negative sonographic Murphy's. Common bile duct: Diameter: Mildly dilated, measuring 9-10 mm. The distal duct is not visualized due to overlying bowel gas. Liver: Mildly increased echotexture suggesting fatty infiltration. No visible intrahepatic biliary ductal dilatation. No focal abnormality. IMPRESSION: Mild distention of the gallbladder. There appears to be a 12 mm stone in the region of the gallbladder neck which is non mobile. No wall thickening. Negative sonographic Murphy's. Common bile duct is dilated, nearly 10 mm, but the distal duct cannot be visualized. May consider further evaluation with MRCP. Electronically Signed   By: Charlett Nose M.D.   On: 09/22/2015 10:28    Scheduled Meds: . carvedilol  12.5 mg Oral BID WC  . heparin  5,000 Units Subcutaneous 3 times per day  . Influenza vac split quadrivalent PF  0.5 mL Intramuscular Tomorrow-1000  . pantoprazole (PROTONIX) IV  40 mg Intravenous Q24H   Continuous Infusions: . sodium chloride 75 mL/hr at 09/21/15 4098    Principal Problem:   Choledocholithiasis Active Problems:   Essential hypertension   Hypertensive urgency   Abdominal pain, right upper quadrant   Transaminitis   GERD (gastroesophageal reflux disease)   Obesity   Abdominal pain   Epigastric pain    Time  spent: 30 minutes   Vassie Loll  [facility] Pager [phone]. If 7PM-7AM, please contact night-coverage at [email or web address], password [facility] 09/22/2015, 11:37 AM  LOS: 1 day

## 2015-09-22 NOTE — Progress Notes (Addendum)
Patient continues to look pretty good. Minimal epigastric pain.  Ultrasound this morning reviewed with Dr. Norval Morton. 12 mm non-mobile stone in the neck of the gallbladder. Mild dilation of the common bile duct at this level noted.  Transaminases have fallen significantly however bilirubin has risen-predominantly direct.  Acute hepatitis panel negative.    Vital signs in last 24 hours: Temp:  [97.7 F (36.5 C)-98.8 F (37.1 C)] 97.7 F (36.5 C) (02/26 0500) Pulse Rate:  [80-96] 96 (02/26 0500) Resp:  [18-30] 18 (02/26 0500) BP: (133-204)/(89-131) 133/89 mmHg (02/26 0500) SpO2:  [95 %-97 %] 96 % (02/26 0500) Last BM Date: 09/22/15 General:   pleasant and cooperative in NAD.  Accompanied by wife and mother Abdomen:  Obese. Positive bowel sounds soft with minimal right upper quadrant abdominal pain. Extremities:  Without clubbing or edema.    Intake/Output from previous day:   Intake/Output this shift:    Lab Results:  Recent Labs  09/21/15 1137 09/22/15 0616  WBC 7.1 6.2  HGB 17.2* 15.6  HCT 51.5 47.5  PLT 215 208   BMET  Recent Labs  09/21/15 1137  NA 137  K 4.3  CL 101  CO2 28  GLUCOSE 111*  BUN 15  CREATININE 0.89  CALCIUM 9.2   LFT  Recent Labs  09/22/15 0616  PROT 6.9  ALBUMIN 3.7  AST 510*  ALT 1433*  ALKPHOS 145*  BILITOT 7.8*  BILIDIR 4.6*  IBILI 3.2*   PT/INR  Recent Labs  09/22/15 0616  LABPROT 14.5  INR 1.11   Hepatitis Panel  Recent Labs  09/21/15 1137  HEPBSAG Negative  HCVAB <0.1  HEPAIGM Negative  HEPBIGM Negative   C-Diff No results for input(s): CDIFFTOX in the last 72 hours.  Studies/Results: Ct Abdomen Pelvis W Contrast  09/21/2015  CLINICAL DATA:  Intermittent upper abdominal pain since last night. EXAM: CT ABDOMEN AND PELVIS WITH CONTRAST TECHNIQUE: Multidetector CT imaging of the abdomen and pelvis was performed using the standard protocol following bolus administration of intravenous contrast. CONTRAST:   OMNIPAQUE IOHEXOL 300 MG/ML SOLN, 50mL OMNIPAQUE IOHEXOL 300 MG/ML SOLN COMPARISON:  None. FINDINGS: Mild atelectasis in the right lung base. Lung bases are otherwise within normal limits. No free air or fluid. The gallbladder is distended with mild increased attenuation in the fat adjacent to the neck of the gallbladder and adjacent bile ducts. No filling defects are seen within the bile ducts. There is mild prominence of the intrahepatic bile ducts as well. The liver and portal vein are otherwise within normal limits. The spleen, adrenal glands, pancreas, and kidneys are within normal limits. No aneurysm or adenopathy identified within the abdomen. The stomach and small bowel are normal. The colon demonstrates scattered diverticuli with no diverticulitis. The appendix is well seen with no evidence of appendicitis. The pelvis demonstrates no adenopathy or mass. The bladder, prostate, and seminal vesicles are normal. No acute bony abnormality. IMPRESSION: There is distention of the gallbladder with mild stranding adjacent to the neck of the gallbladder and adjacent bile ducts. Mild intra hepatic biliary duct prominence. These findings are likely related to the patient's pain. An ultrasound could better evaluate the gallbladder. If there is concern for biliary obstruction after ultrasound and/or labs, an MRCP could better evaluate the biliary tree. Electronically Signed   By: Gerome Sam III M.D   On: 09/21/2015 13:09   US Abdomen Limited Ruq  09/22/2015  CLINICAL DATA:  Right upper quadrant epigastric pain for 2 days. Distended gallbladder  and ducts on CT. EXAM: US ABDOMEN LIMITED - RIGHT UPPER QUADRANT COMPARISON:  CT 09/21/2015 FINDINGS: Gallbladder: The gallbladder is distended. There is a small echogenic focus within the gallbladder neck measuring 12 mm. No wall thickening. Negative sonographic Murphy's. Common bile duct: Diameter: Mildly dilated, measuring 9-10 mm. The distal duct is not visualized  due to overlying bowel gas. Liver: Mildly increased echotexture suggesting fatty infiltration. No visible intrahepatic biliary ductal dilatation. No focal abnormality. IMPRESSION: Mild distention of the gallbladder. There appears to be a 12 mm stone in the region of the gallbladder neck which is non mobile. No wall thickening. Negative sonographic Murphy's. Common bile duct is dilated, nearly 10 mm, but the distal duct cannot be visualized. May consider further evaluation with MRCP. Electronically Signed   By: Charlett Nose M.D.   On: 09/22/2015 10:28     Impression:  60 year old male admitted hospital with epigastric pain, mild intrahepatic biliary dilation on CT and a distended gallbladder with inflammatory changes about the neck. Non-mobile stone in the neck of the gallbladder on ultrasound. Stuttering elevation in enzymes and total bilirubin (predominantly direct). Hepatitis panel negative.  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 Although not documented on imaging, coexisting choledocholithiasis not at all ruled out at this time.  I discussed the case with Dr. Lovell Sheehan. Patient is going to need a cholecystectomy. Extra-hepatic biliary anatomy needs to be further defined prior to surgery. Moreover, he has biochemical and cross-sectional imaging suggestive of partial  biliary obstruction.  At this time, I feel there is good indication for proceeding directly to ERCP forgoing an MRCP as information obtained with the latter modality is not likely going to change the need for biliary decompression.  I had a lengthy discussion regarding this approach with the patient and his wife and mother.The risks, benefits, limitations, alternatives, and mponderable have been reviewed with the patient. I specifically discussed a1 in 10 chance of pancreatitis, reaction to medications, bleeding, perforation and the possibility of a failed ERCP. Potential for sphincterotomy and stent placement also  reviewed. Questions have been answered. All parties agreeable.  The plan for ERCP with therapeutic intervention as appropriate tomorrow unless clinical picture changes.  He will be reassessed first thing tomorrow morning; repeat LFTs.  I have discussed with Dr. Gwenlyn Perking.    Eula Listen  09/22/2015, 12:20 PM

## 2015-09-23 ENCOUNTER — Inpatient Hospital Stay (HOSPITAL_COMMUNITY): Payer: 59

## 2015-09-23 ENCOUNTER — Encounter (HOSPITAL_COMMUNITY): Payer: Self-pay | Admitting: *Deleted

## 2015-09-23 ENCOUNTER — Inpatient Hospital Stay (HOSPITAL_COMMUNITY): Payer: 59 | Admitting: Anesthesiology

## 2015-09-23 ENCOUNTER — Encounter (HOSPITAL_COMMUNITY): Payer: Self-pay | Admitting: Anesthesiology

## 2015-09-23 ENCOUNTER — Encounter (HOSPITAL_COMMUNITY)
Admission: EM | Disposition: A | Discharge status: Home / Self Care | Payer: Self-pay | Source: Home / Self Care | Attending: Internal Medicine

## 2015-09-23 DIAGNOSIS — R945 Abnormal results of liver function studies: Secondary | ICD-10-CM

## 2015-09-23 DIAGNOSIS — R7989 Other specified abnormal findings of blood chemistry: Secondary | ICD-10-CM | POA: Insufficient documentation

## 2015-09-23 DIAGNOSIS — K831 Obstruction of bile duct: Secondary | ICD-10-CM

## 2015-09-23 HISTORY — PX: ERCP: SHX5425

## 2015-09-23 HISTORY — PX: BILIARY STENT PLACEMENT: SHX5538

## 2015-09-23 HISTORY — PX: BALLOON DILATION: SHX5330

## 2015-09-23 HISTORY — PX: SPHINCTEROTOMY: SHX5279

## 2015-09-23 LAB — HEMOGLOBIN A1C
Hgb A1c MFr Bld: 6 % — ABNORMAL HIGH (ref 4.8–5.6)
Mean Plasma Glucose: 126 mg/dL

## 2015-09-23 LAB — COMPREHENSIVE METABOLIC PANEL
ALBUMIN: 3.4 g/dL — AB (ref 3.5–5.0)
ALK PHOS: 152 U/L — AB (ref 38–126)
ALT: 972 U/L — ABNORMAL HIGH (ref 17–63)
ANION GAP: 8 (ref 5–15)
AST: 236 U/L — AB (ref 15–41)
BILIRUBIN TOTAL: 8.5 mg/dL — AB (ref 0.3–1.2)
BUN: 13 mg/dL (ref 6–20)
CALCIUM: 8.2 mg/dL — AB (ref 8.9–10.3)
CO2: 24 mmol/L (ref 22–32)
Chloride: 104 mmol/L (ref 101–111)
Creatinine, Ser: 0.61 mg/dL (ref 0.61–1.24)
GFR calc Af Amer: >60 mL/min (ref >60)
GLUCOSE: 91 mg/dL (ref 65–99)
POTASSIUM: 3.9 mmol/L (ref 3.5–5.1)
Sodium: 136 mmol/L (ref 135–145)
TOTAL PROTEIN: 6.4 g/dL — AB (ref 6.5–8.1)

## 2015-09-23 SURGERY — ERCP, WITH INTERVENTION IF INDICATED
Anesthesia: General

## 2015-09-23 MED ORDER — PHENYLEPHRINE HCL 10 MG/ML IJ SOLN
INTRAMUSCULAR | Status: DC | PRN
  Administered 2015-09-23: 40 ug via INTRAVENOUS
  Administered 2015-09-23: 80 ug via INTRAVENOUS

## 2015-09-23 MED ORDER — PROPOFOL 10 MG/ML IV BOLUS
INTRAVENOUS | Status: DC | PRN
  Administered 2015-09-23: 50 mg via INTRAVENOUS
  Administered 2015-09-23: 150 mg via INTRAVENOUS
  Administered 2015-09-23: 50 mg via INTRAVENOUS

## 2015-09-23 MED ORDER — LIDOCAINE HCL (CARDIAC) 20 MG/ML IV SOLN
INTRAVENOUS | Status: DC | PRN
  Administered 2015-09-23: 50 mg via INTRAVENOUS

## 2015-09-23 MED ORDER — FENTANYL CITRATE (PF) 100 MCG/2ML IJ SOLN
INTRAMUSCULAR | Status: DC | PRN
  Administered 2015-09-23 (×3): 50 ug via INTRAVENOUS

## 2015-09-23 MED ORDER — SODIUM CHLORIDE 0.9 % IV SOLN
INTRAVENOUS | Status: AC
  Filled 2015-09-23: qty 100

## 2015-09-23 MED ORDER — MIDAZOLAM HCL 5 MG/5ML IJ SOLN
INTRAMUSCULAR | Status: DC | PRN
  Administered 2015-09-23: 2 mg via INTRAVENOUS

## 2015-09-23 MED ORDER — FENTANYL CITRATE (PF) 100 MCG/2ML IJ SOLN
INTRAMUSCULAR | Status: AC
  Filled 2015-09-23: qty 2

## 2015-09-23 MED ORDER — FENTANYL CITRATE (PF) 100 MCG/2ML IJ SOLN: 25.0000 ug | INTRAMUSCULAR | Status: DC | PRN

## 2015-09-23 MED ORDER — GLYCOPYRROLATE 0.2 MG/ML IJ SOLN
INTRAMUSCULAR | Status: DC | PRN
  Administered 2015-09-23: 0.6 mg via INTRAVENOUS

## 2015-09-23 MED ORDER — SUCCINYLCHOLINE CHLORIDE 20 MG/ML IJ SOLN
INTRAMUSCULAR | Status: AC
  Filled 2015-09-23: qty 1

## 2015-09-23 MED ORDER — PROPOFOL 10 MG/ML IV BOLUS
INTRAVENOUS | Status: AC
  Filled 2015-09-23: qty 20

## 2015-09-23 MED ORDER — NEOSTIGMINE METHYLSULFATE 10 MG/10ML IV SOLN
INTRAVENOUS | Status: AC
  Filled 2015-09-23: qty 1

## 2015-09-23 MED ORDER — ROCURONIUM BROMIDE 50 MG/5ML IV SOLN
INTRAVENOUS | Status: AC
  Filled 2015-09-23: qty 1

## 2015-09-23 MED ORDER — ONDANSETRON HCL 4 MG/2ML IJ SOLN
INTRAMUSCULAR | Status: AC
  Filled 2015-09-23: qty 2

## 2015-09-23 MED ORDER — LACTATED RINGERS IV SOLN
INTRAVENOUS | Status: DC
  Administered 2015-09-23: 14:00:00 via INTRAVENOUS

## 2015-09-23 MED ORDER — LIDOCAINE HCL (PF) 1 % IJ SOLN
INTRAMUSCULAR | Status: AC
  Filled 2015-09-23: qty 5

## 2015-09-23 MED ORDER — ROCURONIUM BROMIDE 100 MG/10ML IV SOLN
INTRAVENOUS | Status: DC | PRN
  Administered 2015-09-23 (×3): 10 mg via INTRAVENOUS

## 2015-09-23 MED ORDER — EPHEDRINE SULFATE 50 MG/ML IJ SOLN
INTRAMUSCULAR | Status: AC
  Filled 2015-09-23: qty 1

## 2015-09-23 MED ORDER — ONDANSETRON HCL 4 MG/2ML IJ SOLN: 4.0000 mg | Freq: Once | INTRAMUSCULAR | Status: DC | PRN

## 2015-09-23 MED ORDER — GADOBENATE DIMEGLUMINE 529 MG/ML IV SOLN
20.0000 mL | Freq: Once | INTRAVENOUS | Status: AC | PRN
  Administered 2015-09-23: 20 mL via INTRAVENOUS

## 2015-09-23 MED ORDER — EPHEDRINE SULFATE 50 MG/ML IJ SOLN
INTRAMUSCULAR | Status: DC | PRN
  Administered 2015-09-23: 10 mg via INTRAVENOUS

## 2015-09-23 MED ORDER — SODIUM CHLORIDE 0.9 % IJ SOLN
INTRAMUSCULAR | Status: AC
  Filled 2015-09-23: qty 10

## 2015-09-23 MED ORDER — SODIUM CHLORIDE 0.9 % IV SOLN
INTRAVENOUS | Status: DC | PRN
  Administered 2015-09-23: 100 mL

## 2015-09-23 MED ORDER — SUCCINYLCHOLINE CHLORIDE 20 MG/ML IJ SOLN
INTRAMUSCULAR | Status: DC | PRN
  Administered 2015-09-23: 140 mg via INTRAVENOUS

## 2015-09-23 MED ORDER — CEFAZOLIN SODIUM 1-5 GM-% IV SOLN
1.0000 g | INTRAVENOUS | Status: AC
  Administered 2015-09-23: 1 g via INTRAVENOUS
  Filled 2015-09-23 (×2): qty 50

## 2015-09-23 MED ORDER — MIDAZOLAM HCL 2 MG/2ML IJ SOLN
INTRAMUSCULAR | Status: AC
  Filled 2015-09-23: qty 2

## 2015-09-23 MED ORDER — ONDANSETRON HCL 4 MG/2ML IJ SOLN
4.0000 mg | Freq: Once | INTRAMUSCULAR | Status: AC
  Administered 2015-09-23: 4 mg via INTRAVENOUS

## 2015-09-23 MED ORDER — FENTANYL CITRATE (PF) 100 MCG/2ML IJ SOLN
25.0000 ug | INTRAMUSCULAR | Status: AC
  Administered 2015-09-23 (×2): 25 ug via INTRAVENOUS

## 2015-09-23 MED ORDER — NEOSTIGMINE METHYLSULFATE 10 MG/10ML IV SOLN
INTRAVENOUS | Status: DC | PRN
  Administered 2015-09-23: 4 mg via INTRAVENOUS

## 2015-09-23 MED ORDER — LACTATED RINGERS IV SOLN
INTRAVENOUS | Status: DC | PRN
  Administered 2015-09-23: 14:00:00 via INTRAVENOUS

## 2015-09-23 MED ORDER — MIDAZOLAM HCL 2 MG/2ML IJ SOLN
1.0000 mg | INTRAMUSCULAR | Status: DC | PRN
  Administered 2015-09-23: 2 mg via INTRAVENOUS

## 2015-09-23 MED ORDER — GLUCAGON HCL RDNA (DIAGNOSTIC) 1 MG IJ SOLR
INTRAMUSCULAR | Status: AC
  Filled 2015-09-23: qty 2

## 2015-09-23 MED ORDER — GLYCOPYRROLATE 0.2 MG/ML IJ SOLN
INTRAMUSCULAR | Status: AC
  Filled 2015-09-23: qty 3

## 2015-09-23 NOTE — Transfer of Care (Signed)
Immediate Anesthesia Transfer of Care Note  Patient: Joe Kidd  Procedure(s) Performed: Procedure(s): ENDOSCOPIC RETROGRADE CHOLANGIOPANCREATOGRAPHY (ERCP) (N/A) SPHINCTEROTOMY (N/A) BALLOON DILATION (N/A) BILIARY STENT PLACEMENT (N/A)  Patient Location: PACU  Anesthesia Type:General  Level of Consciousness: awake, oriented and patient cooperative  Airway & Oxygen Therapy: Patient Spontanous Breathing and Patient connected to face mask oxygen  Post-op Assessment: Report given to RN and Post -op Vital signs reviewed and stable  Post vital signs: Reviewed and stable  Last Vitals:  Filed Vitals:   09/23/15 1437 09/23/15 1615  BP: 138/88   Pulse:    Temp:  36.4 C  Resp: 21     Complications: No apparent anesthesia complications

## 2015-09-23 NOTE — Anesthesia Postprocedure Evaluation (Signed)
Anesthesia Post Note  Patient: Joe Kidd  Procedure(s) Performed: Procedure(s) (LRB): ENDOSCOPIC RETROGRADE CHOLANGIOPANCREATOGRAPHY (ERCP) (N/A) SPHINCTEROTOMY (N/A) BALLOON DILATION (N/A) BILIARY STENT PLACEMENT (N/A)  Patient location during evaluation: PACU Anesthesia Type: General Level of consciousness: awake and alert and oriented Pain management: pain level controlled Vital Signs Assessment: post-procedure vital signs reviewed and stable Respiratory status: spontaneous breathing and respiratory function stable Cardiovascular status: stable Postop Assessment: no signs of nausea or vomiting Anesthetic complications: no    Last Vitals:  Filed Vitals:   09/23/15 1615 09/23/15 1630  BP: 133/88 159/105  Pulse: 95 85  Temp: 36.4 C   Resp: 24 17    Last Pain:  Filed Vitals:   09/23/15 1641  PainSc: Asleep                 Baillie Mohammad A

## 2015-09-23 NOTE — Anesthesia Preprocedure Evaluation (Addendum)
Anesthesia Evaluation  Patient identified by MRN, date of birth, ID band Patient awake    Reviewed: Allergy & Precautions, NPO status , Patient's Chart, lab work & pertinent test results  Airway Mallampati: II  TM Distance: >3 FB Neck ROM: Full    Dental  (+) Teeth Intact, Dental Advisory Given   Pulmonary neg pulmonary ROS,    Pulmonary exam normal        Cardiovascular hypertension, Normal cardiovascular exam     Neuro/Psych negative neurological ROS  negative psych ROS   GI/Hepatic Neg liver ROS, GERD  ,  Endo/Other  Morbid obesity  Renal/GU negative Renal ROS  negative genitourinary   Musculoskeletal negative musculoskeletal ROS (+)   Abdominal   Peds negative pediatric ROS (+)  Hematology negative hematology ROS (+)   Anesthesia Other Findings   Reproductive/Obstetrics negative OB ROS                           Anesthesia Physical Anesthesia Plan  ASA: II  Anesthesia Plan: General   Post-op Pain Management:    Induction: Intravenous  Airway Management Planned: Oral ETT  Additional Equipment:   Intra-op Plan:   Post-operative Plan: Extubation in OR  Informed Consent: I have reviewed the patients History and Physical, chart, labs and discussed the procedure including the risks, benefits and alternatives for the proposed anesthesia with the patient or authorized representative who has indicated his/her understanding and acceptance.   Dental advisory given  Plan Discussed with: CRNA, Anesthesiologist and Surgeon  Anesthesia Plan Comments:         Anesthesia Quick Evaluation

## 2015-09-23 NOTE — Progress Notes (Signed)
ERCP performed. Common bile duct diminutive in appearance. No tight stricture encountered. Suspect extrinsic compression from the gallbladder. Upstream dilation beginning at the common hepatic duct with a single filling defect. Intrahepatic biliary dilation. Sphincterotomy performed. Distal common bile duct brushings for cytology. Upstream CHD stone could not be removed today. A 10 French, 9 cm plastic biliary stent placed.  Clear liquid diet. Repeat LFTs tomorrow morning. Surgical consultation for cholecystectomy. Stent and common hepatic duct stone removal at a later date.  Full dictated note to follow.

## 2015-09-23 NOTE — Progress Notes (Signed)
[facility] PROGRESS NOTE  Joe Kidd ZOX:[id number] DOB: May 03, 1956 DOA: 09/21/2015 PCP: Colette Ribas, MD  Assessment/Plan: 1-Abdominal pain: Epigastric area and right upper quadrant -CT scan demonstrating choledocholithiasis; Patient also with transaminitis but no signs of cholangitis -LFT's trending down -Bilirubin is up again -GI and general surgery on board; will follow rec's -Right upper quadrant ultrasound as reported below. MRCP abnormal and with plans for ERCP later today  -Patient without fever, normal WBCs -Will continue holding on antibiotics for now as recommended by GI. Patient non-toxic -follow LFT's in am  2-Choledocholithiasis/cholelithiasis : -Will eventually need cholecystectomy -GI is on board -continue waiting condition to cool off -LFT's continue trending down; but bilirubin is up.  3-Essential hypertension: Newly diagnosed; with Hypertensive urgency On admission. -Most likely associated with ongoing pain on top of underlying unknown hypertension -started on Coreg 12.5 mg twice a day -PRN hydralazine -Pain medication as mentioned in problem #1 -follow response and adjust antihypertensives as needed  -BP is better   4-Transaminitis: Most likely associated with choledocholithiasis -Hepatitis panel negative -decreasing appropriately and steadily  -Will continue supportive care and follow LFTs trend -MRCP suggesting some defect on common bile duct and confirming stone in neck of gallbladder. ERCP to be done later. -will follow GI rec's  5-GERD (gastroesophageal reflux disease) -Will continue use PPI  6-Obesity: -Low calorie diet and exercise has been discussed with patient -Body mass index is 37.48 kg/(m^2).   7-hyperglycemia: Without history of diabetes - A1C 6.0 -repeat fasting glucose in a.m. 111 -will need lifestyle changes and close outpatient follow up  Code Status: Full Family Communication: wife and mother at bedside   Disposition Plan: remains inpatient; continue supportive care; follow GI rec's and MRCP results. Will most likely need cholecystectomy during this admission.    Consultants:  GI  General surgery   Procedures:  Right upper quadrant ultrasound: Demonstrated occult bladder dissection with gallstone retained in the neck of the gallbladder. There is also common bile duct dilatation at 10 mm.  MRCP: with some defect appreciated in common bile duct   ERCP planned for later today   Antibiotics:  None  HPI/Subjective: Patient is overall feeling better, denies abdominal pain, nausea, vomiting, chest pain or shortness of breath. He is afebrile.   Objective: Filed Vitals:   09/23/15 1630 09/23/15 1645  BP: 159/105 146/97  Pulse: 85   Temp:    Resp: 17     Intake/Output Summary (Last 24 hours) at 09/23/15 1912 Last data filed at 09/23/15 1838  Gross per 24 hour  Intake   1540 ml  Output      0 ml  Net   1540 ml   Filed Weights   09/21/15 1112  Weight: 128.822 kg (284 lb)    Exam:   General:  Afebrile, denies chest pain, denies shortness of breath; slight jaundice/icterus appreciated and unchanged. No nausea, no vomiting, no abdominal pain.  Cardiovascular: S1 and S2, no rubs, no gallops  Respiratory: Clear to auscultation bilaterally; no use of accessory muscles.  Abdomen: Obese, Soft, no guarding, positive bowel sounds, no distention  Musculoskeletal: Trace of edema bilaterally, no cyanosis.  Data Reviewed: Basic Metabolic Panel:  Recent Labs Lab 09/21/15 1137 09/21/15 2051 09/23/15 0525  NA 137  --  136  K 4.3  --  3.9  CL 101  --  104  CO2 28  --  24  GLUCOSE 111*  --  91  BUN 15  --  13  CREATININE 0.89  --  0.61  CALCIUM 9.2  --  8.2*  MG  --  1.9  --    Liver Function Tests:  Recent Labs Lab 09/21/15 1137 09/22/15 0616 09-30-2015 0525  AST 1316* 510* 236*  ALT 1867* 1433* 972*  ALKPHOS 111 145* 152*  BILITOT 3.4* 7.8* 8.5*  PROT 8.1 6.9  6.4*  ALBUMIN 4.5 3.7 3.4*    Recent Labs Lab 09/21/15 1137  LIPASE 27   CBC:  Recent Labs Lab 09/21/15 1137 09/22/15 0616  WBC 7.1 6.2  NEUTROABS 4.9  --   HGB 17.2* 15.6  HCT 51.5 47.5  MCV 91.2 91.3  PLT 215 208   Cardiac Enzymes:  Recent Labs Lab 09/21/15 1137  TROPONINI <0.03   Studies: Mr 3d Recon At Scanner  09/30/2015  ADDENDUM REPORT: Sep 30, 2015 11:04 ADDENDUM: Further review of this study does show mild dilatation of the central intrahepatic bile ducts and common hepatic duct which measures approximately 8 mm. The common bile duct does not appear dilated distal to this point, with suggestion of a transition zone in the distal common hepatic duct near the cystic duct insertion site. This shows slight shows mild wall thickening and enhancement (series 5009, image 48), but no definite obstructing calculus is visualized. This is suspicious for a stricture, which may be inflammatory in etiology, although neoplasm cannot definitely be excluded. Consider ERCP for further evaluation. These findings were discussed with Dr. Kendell Bane by telephone at the time of this addendum at 1100 hours on Sep 30, 2015. Electronically Signed   By: Myles Rosenthal M.D.   On: 09/30/15 11:04  Sep 30, 2015  CLINICAL DATA:  Upper abdominal pain with cholelithiasis, distended gallbladder, and biliary dilatation seen on recent ultrasound and CT. EXAM: MRI ABDOMEN WITHOUT AND WITH CONTRAST (INCLUDING MRCP) TECHNIQUE: Multiplanar multisequence MR imaging of the abdomen was performed both before and after the administration of intravenous contrast. Heavily T2-weighted images of the biliary and pancreatic ducts were obtained, and three-dimensional MRCP images were rendered by post processing. CONTRAST:  20mL MULTIHANCE GADOBENATE DIMEGLUMINE 529 MG/ML IV SOLN COMPARISON:  Ultrasound on 09/22/2015 and CT on 09/21/2015 FINDINGS: Lower chest:  Mild bibasilar atelectasis noted. Hepatobiliary: A tiny sub-cm cyst is seen in  the posterior right hepatic lobe, however no liver masses are identified. The gallbladder is distended and contains 2 small less than 1 cm stones in the gallbladder neck. There is no evidence of gallbladder wall thickening, although there is minimal pericholecystic edema adjacent to the gallbladder neck, best seen on image 21 of series 4. This is suspicious for acute cholecystitis. Exam is partially degraded by motion artifact. There is no definite evidence of biliary ductal dilatation or choledocholithiasis. Pancreas: No mass, inflammatory changes, or other parenchymal abnormality identified. No evidence of pancreatic ductal dilatation. Spleen:  Within normal limits in size and appearance. Adrenals/Urinary Tract: No masses identified. No evidence of hydronephrosis. Stomach/Bowel: Tiny hiatal hernia noted. Visualized portions within the abdomen are unremarkable. Vascular/Lymphatic: No pathologically enlarged lymph nodes identified. No abdominal aortic aneurysm demonstrated. Other:  None. Musculoskeletal:  No suspicious bone lesions identified. IMPRESSION: Distended gallbladder with 2 tiny less than 1 cm gallstones in the gallbladder neck and mild pericholecystic edema, suspicious for acute cholecystitis. Recommend clinical correlation, and consider nuclear medicine hepatobiliary imaging if clinically warranted. Exam degradation by motion artifact. No definite evidence of biliary ductal dilatation or choledocholithiasis. Mild bibasilar atelectasis. Tiny hiatal hernia. Electronically Signed: By: Myles Rosenthal M.D. On: 09-30-15 09:52   Dg Ercp With Sphincterotomy  09/30/15  CLINICAL DATA:  60 year old male status post ERCP with sphincterotomy EXAM: ERCP TECHNIQUE: Multiple spot images obtained with the fluoroscopic device and submitted for interpretation post-procedure. FLUOROSCOPY TIME:  If the device does not provide the exposure index: Fluoroscopy Time:  3 minutes 14 seconds reported COMPARISON:  MRCP  09/23/2015 FINDINGS: A total of 6 intraoperative spot images in for sending clips are submitted. The images demonstrate a flexible endoscope in the descending duodenum followed by cannulation of the common bile duct. Opacification of the biliary tree demonstrates mild dilatation of the common bile duct with several filling defects concerning for choledocholithiasis. The subsequent images show sphincterotomy and balloon sweeping of the common duct and finally placement of a plastic biliary stent. IMPRESSION: 1. ERCP demonstrates mild dilatation of the common bile duct with filling defects concerning for possible choledocholithiasis. 2. Subsequently sphincterotomy and balloon sweep of the duct are performed followed by placement of a plastic biliary stent. These images were submitted for radiologic interpretation only. Please see the procedural report for the amount of contrast and the fluoroscopy time utilized. Electronically Signed   By: Malachy Moan M.D.   On: 09/23/2015 16:18   Mr Roe Coombs W/wo Cm/mrcp  09/23/2015  ADDENDUM REPORT: 09/23/2015 11:04 ADDENDUM: Further review of this study does show mild dilatation of the central intrahepatic bile ducts and common hepatic duct which measures approximately 8 mm. The common bile duct does not appear dilated distal to this point, with suggestion of a transition zone in the distal common hepatic duct near the cystic duct insertion site. This shows slight shows mild wall thickening and enhancement (series 5009, image 48), but no definite obstructing calculus is visualized. This is suspicious for a stricture, which may be inflammatory in etiology, although neoplasm cannot definitely be excluded. Consider ERCP for further evaluation. These findings were discussed with Dr. Kendell Bane by telephone at the time of this addendum at 1100 hours on 09/23/2015. Electronically Signed   By: Myles Rosenthal M.D.   On: 09/23/2015 11:04  09/23/2015  CLINICAL DATA:  Upper abdominal pain with  cholelithiasis, distended gallbladder, and biliary dilatation seen on recent ultrasound and CT. EXAM: MRI ABDOMEN WITHOUT AND WITH CONTRAST (INCLUDING MRCP) TECHNIQUE: Multiplanar multisequence MR imaging of the abdomen was performed both before and after the administration of intravenous contrast. Heavily T2-weighted images of the biliary and pancreatic ducts were obtained, and three-dimensional MRCP images were rendered by post processing. CONTRAST:  20mL MULTIHANCE GADOBENATE DIMEGLUMINE 529 MG/ML IV SOLN COMPARISON:  Ultrasound on 09/22/2015 and CT on 09/21/2015 FINDINGS: Lower chest:  Mild bibasilar atelectasis noted. Hepatobiliary: A tiny sub-cm cyst is seen in the posterior right hepatic lobe, however no liver masses are identified. The gallbladder is distended and contains 2 small less than 1 cm stones in the gallbladder neck. There is no evidence of gallbladder wall thickening, although there is minimal pericholecystic edema adjacent to the gallbladder neck, best seen on image 21 of series 4. This is suspicious for acute cholecystitis. Exam is partially degraded by motion artifact. There is no definite evidence of biliary ductal dilatation or choledocholithiasis. Pancreas: No mass, inflammatory changes, or other parenchymal abnormality identified. No evidence of pancreatic ductal dilatation. Spleen:  Within normal limits in size and appearance. Adrenals/Urinary Tract: No masses identified. No evidence of hydronephrosis. Stomach/Bowel: Tiny hiatal hernia noted. Visualized portions within the abdomen are unremarkable. Vascular/Lymphatic: No pathologically enlarged lymph nodes identified. No abdominal aortic aneurysm demonstrated. Other:  None. Musculoskeletal:  No suspicious bone lesions identified. IMPRESSION: Distended gallbladder with 2 tiny less than  1 cm gallstones in the gallbladder neck and mild pericholecystic edema, suspicious for acute cholecystitis. Recommend clinical correlation, and consider  nuclear medicine hepatobiliary imaging if clinically warranted. Exam degradation by motion artifact. No definite evidence of biliary ductal dilatation or choledocholithiasis. Mild bibasilar atelectasis. Tiny hiatal hernia. Electronically Signed: By: Myles Rosenthal M.D. On: 09/23/2015 09:52   US Abdomen Limited Ruq  09/22/2015  CLINICAL DATA:  Right upper quadrant epigastric pain for 2 days. Distended gallbladder and ducts on CT. EXAM: US ABDOMEN LIMITED - RIGHT UPPER QUADRANT COMPARISON:  CT 09/21/2015 FINDINGS: Gallbladder: The gallbladder is distended. There is a small echogenic focus within the gallbladder neck measuring 12 mm. No wall thickening. Negative sonographic Murphy's. Common bile duct: Diameter: Mildly dilated, measuring 9-10 mm. The distal duct is not visualized due to overlying bowel gas. Liver: Mildly increased echotexture suggesting fatty infiltration. No visible intrahepatic biliary ductal dilatation. No focal abnormality. IMPRESSION: Mild distention of the gallbladder. There appears to be a 12 mm stone in the region of the gallbladder neck which is non mobile. No wall thickening. Negative sonographic Murphy's. Common bile duct is dilated, nearly 10 mm, but the distal duct cannot be visualized. May consider further evaluation with MRCP. Electronically Signed   By: Charlett Nose M.D.   On: 09/22/2015 10:28    Scheduled Meds: . carvedilol  12.5 mg Oral BID WC  . Influenza vac split quadrivalent PF  0.5 mL Intramuscular Tomorrow-1000  . pantoprazole (PROTONIX) IV  40 mg Intravenous Q24H   Continuous Infusions: . sodium chloride 75 mL/hr at 09/21/15 1610    Principal Problem:   Choledocholithiasis Active Problems:   Essential hypertension   Hypertensive urgency   Abdominal pain, right upper quadrant   Transaminitis   GERD (gastroesophageal reflux disease)   Obesity   Abdominal pain   Epigastric pain   Elevated liver enzymes   Elevated LFTs   Biliary stricture    Time spent:  30 minutes   Vassie Loll  [facility] Pager [phone]. If 7PM-7AM, please contact night-coverage at [email or web address], password [facility] 09/23/2015, 7:12 PM  LOS: 2 days

## 2015-09-23 NOTE — Progress Notes (Signed)
MRCP has been reviewed with Drs. Stahl and poff.  As described in radiology report, suggestive of at least a transition zone at the level of the cystic duct insertion with relative upstream dilation. 2 stones in the neck of the gallbladder.  ERCP rescheduled for later today per plan.

## 2015-09-23 NOTE — Anesthesia Procedure Notes (Signed)
Procedure Name: Intubation Date/Time: 09/23/2015 2:49 PM Performed by: Pernell Dupre, AMY A Pre-anesthesia Checklist: Patient identified, Patient being monitored, Timeout performed, Emergency Drugs available and Suction available Patient Re-evaluated:Patient Re-evaluated prior to inductionOxygen Delivery Method: Circle System Utilized Preoxygenation: Pre-oxygenation with 100% oxygen Intubation Type: IV induction, Rapid sequence and Cricoid Pressure applied Laryngoscope Size: 3 and Miller Grade View: Grade I Tube type: Oral Tube size: 7.0 mm Number of attempts: 1 Airway Equipment and Method: Stylet Placement Confirmation: ETT inserted through vocal cords under direct vision,  positive ETCO2 and breath sounds checked- equal and bilateral Secured at: 21 cm Tube secured with: Tape Dental Injury: Teeth and Oropharynx as per pre-operative assessment

## 2015-09-23 NOTE — Op Note (Addendum)
[facility] [address] [place], [address]   ERCP PROCEDURE REPORT        EXAM DATE: 09/23/2015  PATIENT NAME:          Joe Kidd, Joe Kidd          MR #:        [id number]  BIRTHDATE:       Jul 09, 1956     VISIT #:     [id number] ATTENDING:     Jonathon Bellows, MD Caleen Essex     STATUS: outpatient REFERRING MD:       Dr. Gwenlyn Perking  INDICATIONS:  The patient is a 60 yr old male here for an ERCP due to partial biliary obstruction in the setting of acute cholecystitis PROCEDURE PERFORMED:     ERCP with biliary sphincterotomy biliary sphincterotomy balloon dilation, bile duct brushing and plastic biliary stent placement MEDICATIONS:     General endotracheal anesthesia induced by Dr. [facility]     Ancef 1 g IV  CONSENT: The patient understands the risks and benefits of the procedure and understands that these risks include, but are not limited to: sedation, allergic reaction, infection, perforation and/or bleeding. Alternative means of evaluation and treatment include, among others: physical exam, x-rays, and/or surgical intervention. The patient elects to proceed with this endoscopic procedure.  DESCRIPTION OF PROCEDURE: During intra-op preparation period all mechanical & medical equipment was checked for proper function. Hand hygiene and appropriate measures for infection prevention was taken. After the risks, benefits and alternatives of the procedure were thoroughly explained, Informed was verified, confirmed and timeout was successfully executed by the treatment team. With the patient in left semi-prone position, medications were administered intravenously.The    was passed from the mouth into the esophagus and further advanced from the esophagus into the stomach. From stomach scope was directed to the second portion of the duodenum. Major papilla was aligned with the duodenoscope. The scope position was confirmed fluoroscopically. Rest of the  findings/therapeutics are given below. The scope was then completely withdrawn from the patient and the procedure completed. The pulse, BP, and O2 saturation were monitored and documented by the physician and the nursing staff throughout the entire procedure. The patient was cared for as planned according to standard protocol. The patient was then discharged to recovery in stable condition and with appropriate post procedure care. Estimated blood loss is zero unless otherwise noted in this procedure report.  Cursory examination of the distal esophagus and stomach revealed only a small hiatal hernia.  Pylorus easily traversed.  The ampulla was readily identified on the medial wall the second portion of the duodenum scope was pulled back to the short position, 55 cm from incisors and scout film was taken.  I approached the ampulla with the 44 autotome using guide wire palpation almost immediately achieved deep biliary cannulation. The entire distal bile duct appeared to be diminutive and compressed.  At the level of the junction between the common bile and common hepatic duct, there was a transition point where the common hepatic duct all way up into the intrahepatic segment appeared to be relatively dilated;  There was an approximate 4-5 millimeter filling defect in the common hepatic duct as well.  The narrowing of the common bile duct did not appear to be result of a tight hard stricture.  More likely, this is due to extrinsic compression from markedly dilated inflamed gallbladder.  Using a safety wire positioned deep into biliary tree, a biliary  sphincterotomy was performed at the 12:00 position with the Erbe unit.  It was somewhat limited size of sphincterotomy because of the limited intramural segment.  Subsequently, I placed a CRE dilating balloon and inflated it to about 7 mm for a minute and then took down.  This opened up the sphincterotomy a little bit more without  difficulty or apparent complication.  Subsequently, I brushed the distal bile duct for cytology.  I attempted to remove the common hepatic duct stone with an extractor balloon; I easily advanced the extractor balloon over the guidewire to the common hepatic duct and inflated it just smaller than the duct.  However, I engaged the stone but it would not pull through the narrowed common bile duct segment. Subsequently, I elected to remove the balloon and I placed a 10 French, 9 cm plastic stent across the narrowed common bile duct. This stent appear to be in excellent position after deployment.     ADVERSE EVENT:     none    . The pancreatic duct was not injected or manipulated IMPRESSIONS:     Probable extrinsic compression on the bile duct due to a markedly inflamed distended gallbladder and not an intrinsic biliary stricture. Choledocholithiasis.  Status post biliary sphincterotomy, biliary sphincterotomy balloon dilation, bile duct brushing and plastic biliary stent placement  RECOMMENDATIONS:     Clear liquid diet. Hepatic profile tomorrow morning. Surgery consultation for cholecystectomy REPEAT EXAM:     Elective outpatient ERCP for stent and common duct stone removal at a later date.   ___________________________________ R.  Roetta Sessions, MD FACP [facility] eSigned:  R. Roetta Sessions, MD [facility] 09/23/2015 5:57 PM Revised: 09/23/2015 5:57 PM  cc:     PATIENT NAME:  Joe, Kidd MR#: [id number]

## 2015-09-23 NOTE — Progress Notes (Signed)
Subjective: Twinges of epigastric pain overnight. Mild nausea. No vomiting. ERCP on hold today until MRCP completed. Discussed with patient, who is agreeable to this. Only complaint is that he desires to advance diet and misses eating.  Objective: Vital signs in last 24 hours: Temp:  [97.1 F (36.2 C)-99.6 F (37.6 C)] 98.3 F (36.8 C) (02/27 0605) Pulse Rate:  [73-80] 80 (02/27 0605) Resp:  [20] 20 (02/27 0605) BP: (127-152)/(82-96) 149/91 mmHg (02/27 0605) SpO2:  [94 %-98 %] 94 % (02/27 0605) Last BM Date: 09/22/15 General:   Alert and oriented, pleasant Head:  Normocephalic and atraumatic. Abdomen:  Bowel sounds present, soft, obese, no TTP, sitting up in chair Msk:  Symmetrical without gross deformities. Normal posture. Extremities:  Without edema. Neurologic:  Alert and  oriented x4;  grossly normal neurologically. Psych:  Alert and cooperative. Normal mood and affect.  Intake/Output from previous day: 02/26 0701 - 02/27 0700 In: 1080 [P.O.:1080] Out: -  Intake/Output this shift:    Lab Results:  Recent Labs  09/21/15 1137 09/22/15 0616  WBC 7.1 6.2  HGB 17.2* 15.6  HCT 51.5 47.5  PLT 215 208   BMET  Recent Labs  09/21/15 1137 09/23/15 0525  NA 137 136  K 4.3 3.9  CL 101 104  CO2 28 24  GLUCOSE 111* 91  BUN 15 13  CREATININE 0.89 0.61  CALCIUM 9.2 8.2*   LFT  Recent Labs  09/21/15 1137 09/22/15 0616 09/23/15 0525  PROT 8.1 6.9 6.4*  ALBUMIN 4.5 3.7 3.4*  AST 1316* 510* 236*  ALT 1867* 1433* 972*  ALKPHOS 111 145* 152*  BILITOT 3.4* 7.8* 8.5*  BILIDIR  --  4.6*  --   IBILI  --  3.2*  --    PT/INR  Recent Labs  09/22/15 0616  LABPROT 14.5  INR 1.11   Hepatitis Panel  Recent Labs  09/21/15 1137  HEPBSAG Negative  HCVAB <0.1  HEPAIGM Negative  HEPBIGM Negative     Studies/Results: Ct Abdomen Pelvis W Contrast  09/21/2015  CLINICAL DATA:  Intermittent upper abdominal pain since last night. EXAM: CT ABDOMEN AND PELVIS  WITH CONTRAST TECHNIQUE: Multidetector CT imaging of the abdomen and pelvis was performed using the standard protocol following bolus administration of intravenous contrast. CONTRAST:  OMNIPAQUE IOHEXOL 300 MG/ML SOLN, 50mL OMNIPAQUE IOHEXOL 300 MG/ML SOLN COMPARISON:  None. FINDINGS: Mild atelectasis in the right lung base. Lung bases are otherwise within normal limits. No free air or fluid. The gallbladder is distended with mild increased attenuation in the fat adjacent to the neck of the gallbladder and adjacent bile ducts. No filling defects are seen within the bile ducts. There is mild prominence of the intrahepatic bile ducts as well. The liver and portal vein are otherwise within normal limits. The spleen, adrenal glands, pancreas, and kidneys are within normal limits. No aneurysm or adenopathy identified within the abdomen. The stomach and small bowel are normal. The colon demonstrates scattered diverticuli with no diverticulitis. The appendix is well seen with no evidence of appendicitis. The pelvis demonstrates no adenopathy or mass. The bladder, prostate, and seminal vesicles are normal. No acute bony abnormality. IMPRESSION: There is distention of the gallbladder with mild stranding adjacent to the neck of the gallbladder and adjacent bile ducts. Mild intra hepatic biliary duct prominence. These findings are likely related to the patient's pain. An ultrasound could better evaluate the gallbladder. If there is concern for biliary obstruction after ultrasound and/or labs, an MRCP  could better evaluate the biliary tree. Electronically Signed   By: Gerome Sam III M.D   On: 09/21/2015 13:09   US Abdomen Limited Ruq  09/22/2015  CLINICAL DATA:  Right upper quadrant epigastric pain for 2 days. Distended gallbladder and ducts on CT. EXAM: US ABDOMEN LIMITED - RIGHT UPPER QUADRANT COMPARISON:  CT 09/21/2015 FINDINGS: Gallbladder: The gallbladder is distended. There is a small echogenic focus within  the gallbladder neck measuring 12 mm. No wall thickening. Negative sonographic Murphy's. Common bile duct: Diameter: Mildly dilated, measuring 9-10 mm. The distal duct is not visualized due to overlying bowel gas. Liver: Mildly increased echotexture suggesting fatty infiltration. No visible intrahepatic biliary ductal dilatation. No focal abnormality. IMPRESSION: Mild distention of the gallbladder. There appears to be a 12 mm stone in the region of the gallbladder neck which is non mobile. No wall thickening. Negative sonographic Murphy's. Common bile duct is dilated, nearly 10 mm, but the distal duct cannot be visualized. May consider further evaluation with MRCP. Electronically Signed   By: Charlett Nose M.D.   On: 09/22/2015 10:28    Assessment: 60 year old very pleasant male admitted with epigastric pain, mild intrahepatic biliary dilation on CT and distended gallbladder, non-mobile stone in neck of gallbladder on ultrasound. Transaminases continue to improve but bilirubin slowly rising. Clinically, patient is doing quite well and does not appear acutely ill. Question of Mirizzi syndrome. As the clinical picture has evolved and laboratory data further available, it is felt best to start with a least invasive approach via MRI/MRCP today to further aid in assessment of clinical scenario. Discussed with Dr. Jena Gauss prior to seeing patient. I reviewed most recent labwork with patient and explained the rationale behind postponing the ERCP in favor of an MRI/MRCP. He is agreeable to this and understands the reasoning behind avoidance of an ERCP unless deemed clinically necessary. Ultimately, he will need a cholecystectomy.   Plan: MRI/MRCP as soon as possible today Remain NPO May or may not need ERCP; will review MRCP first Ultimately needs cholecystectomy  Will continue to follow with you  Nira Retort, ANP-BC [facility]    LOS: 2 days    09/23/2015, 8:20 AM    Addendum at 11:15am:  I spoke with patient and wife. They are aware that an ERCP is scheduled for this afternoon. Discussed 1 in 10 chance of pancreatitis, reaction to medications, bleeding, perforation, infection. Discussed potential stent placement, sphincterotomy. Ancef ordered on call and I notified Cicely, RN on 300 regarding this.  Nira Retort, ANP-BC [facility]

## 2015-09-24 ENCOUNTER — Telehealth: Payer: Self-pay | Admitting: Gastroenterology

## 2015-09-24 DIAGNOSIS — K8021 Calculus of gallbladder without cholecystitis with obstruction: Secondary | ICD-10-CM | POA: Insufficient documentation

## 2015-09-24 LAB — HEPATIC FUNCTION PANEL
ALK PHOS: 163 U/L — AB (ref 38–126)
ALT: 727 U/L — AB (ref 17–63)
AST: 153 U/L — ABNORMAL HIGH (ref 15–41)
Albumin: 3.3 g/dL — ABNORMAL LOW (ref 3.5–5.0)
BILIRUBIN DIRECT: 3.1 mg/dL — AB (ref 0.1–0.5)
BILIRUBIN INDIRECT: 2.4 mg/dL — AB (ref 0.3–0.9)
BILIRUBIN TOTAL: 5.5 mg/dL — AB (ref 0.3–1.2)
TOTAL PROTEIN: 6.3 g/dL — AB (ref 6.5–8.1)

## 2015-09-24 LAB — SURGICAL PCR SCREEN
MRSA, PCR: NEGATIVE
Staphylococcus aureus: NEGATIVE

## 2015-09-24 MED ORDER — CIPROFLOXACIN IN D5W 400 MG/200ML IV SOLN: 400.0000 mg | INTRAVENOUS | Status: DC

## 2015-09-24 MED ORDER — PROPOFOL 10 MG/ML IV BOLUS
INTRAVENOUS | Status: AC
  Filled 2015-09-24: qty 20

## 2015-09-24 MED ORDER — CHLORHEXIDINE GLUCONATE 4 % EX LIQD
1.0000 "application " | Freq: Once | CUTANEOUS | Status: AC
  Administered 2015-09-24: 1 via TOPICAL
  Filled 2015-09-24: qty 15

## 2015-09-24 NOTE — Progress Notes (Signed)
[facility] PROGRESS NOTE  Dustan Hyams RUE:[id number] DOB: [birth year] DOA: 09/21/2015 PCP: Colette Ribas, MD  Assessment/Plan: 1-Abdominal pain: Epigastric area and right upper quadrant -CT scan demonstrating choledocholithiasis; Patient also with transaminitis but no signs of cholangitis -LFT's continue trending down -Bilirubin is trending down; mild icterus and jaundice resolved -GI and general surgery on board; will follow rec's -Right upper quadrant ultrasound as reported below. MRCP abnormal and patient status post ERCP with sphincterectomy and stenting on 2/27  -plan for cholecystectomy on 09/25/15 -Will continue holding on antibiotics for now as recommended by GI. Patient non-toxic, afebrile and normal WBC's  2-Choledocholithiasis/cholelithiasis : -Will eventually need cholecystectomy -GI is on board -plan is for cholecystectomy in am (09/25/15) -LFT's continue trending down and bilirubin is now also coming down.  3-Essential hypertension: Newly diagnosed; with Hypertensive urgency On admission. -Most likely associated with ongoing pain on top of underlying unknown hypertension -on Coreg 12.5 mg twice a day now -PRN hydralazine -Pain medication as mentioned in problem #1 -follow response and adjust antihypertensives as needed  -BP is much better   4-Transaminitis: Most likely associated with choledocholithiasis -Hepatitis panel negative -LFT's continue decreasing appropriately and steadily  -Will continue supportive care and follow LFTs trend -MRCP suggesting some defect on common bile duct and confirming stone in neck of gallbladder. S/P ERCP with sphincterectomy and stenting 2/27. -will follow GI rec's  5-GERD (gastroesophageal reflux disease) -Will continue use PPI  6-Obesity: -Low calorie diet and exercise has been discussed with patient -Body mass index is 37.48 kg/(m^2).   7-hyperglycemia: Without history of diabetes - A1C 6.0 -repeat fasting glucose  in a.m. 111 -will need lifestyle changes and close outpatient follow up  Code Status: Full Family Communication: wife and mother at bedside  Disposition Plan: remains inpatient; continue supportive care. LFT's and bilirubin trending down. S/P ERCP and with plans for lap cholecystectomy in am (09/25/15).   Consultants:  GI  General surgery   Procedures:  Right upper quadrant ultrasound: Demonstrated occult bladder dissection with gallstone retained in the neck of the gallbladder. There is also common bile duct dilatation at 10 mm.  MRCP: with some defect appreciated in common bile duct   ERCP planned for later today   Antibiotics:  None  HPI/Subjective: Patient is overall feeling better, denies abdominal pain, nausea, vomiting, chest pain or shortness of breath. He has remained afebrile and his LFT's and bilirubin are trending down.   Objective: Filed Vitals:   09/24/15 0541 09/24/15 1309  BP: 165/98 141/86  Pulse: 67 75  Temp: 98.5 F (36.9 C) 98.7 F (37.1 C)  Resp: 20 20    Intake/Output Summary (Last 24 hours) at 09/24/15 1335 Last data filed at 09/24/15 1310  Gross per 24 hour  Intake   1780 ml  Output      0 ml  Net   1780 ml   Filed Weights   09/21/15 1112  Weight: 128.822 kg (284 lb)    Exam:   General:  Afebrile, denies chest pain, denies shortness of breath; S/P ERCP with sphinterectomy and stenting; no jaundice/icterus. No nausea, no vomiting, no abdominal pain.  Cardiovascular: S1 and S2, no rubs, no gallops  Respiratory: Clear to auscultation bilaterally; no use of accessory muscles.  Abdomen: Obese, Soft, no guarding, positive bowel sounds, no distention  Musculoskeletal: Trace of edema bilaterally, no cyanosis.  Data Reviewed: Basic Metabolic Panel:  Recent Labs Lab 09/21/15 1137 09/21/15 2051 09/23/15 0525  NA 137  --  136  K 4.3  --  3.9  CL 101  --  104  CO2 28  --  24  GLUCOSE 111*  --  91  BUN 15  --  13  CREATININE 0.89   --  0.61  CALCIUM 9.2  --  8.2*  MG  --  1.9  --    Liver Function Tests:  Recent Labs Lab 09/21/15 1137 09/22/15 0616 Oct 02, 2015 0525 09/24/15 0532  AST 1316* 510* 236* 153*  ALT 1867* 1433* 972* 727*  ALKPHOS 111 145* 152* 163*  BILITOT 3.4* 7.8* 8.5* 5.5*  PROT 8.1 6.9 6.4* 6.3*  ALBUMIN 4.5 3.7 3.4* 3.3*    Recent Labs Lab 09/21/15 1137  LIPASE 27   CBC:  Recent Labs Lab 09/21/15 1137 09/22/15 0616  WBC 7.1 6.2  NEUTROABS 4.9  --   HGB 17.2* 15.6  HCT 51.5 47.5  MCV 91.2 91.3  PLT 215 208   Cardiac Enzymes:  Recent Labs Lab 09/21/15 1137  TROPONINI <0.03   Studies: Mr 3d Recon At Scanner  10-02-15  ADDENDUM REPORT: 10-02-2015 11:04 ADDENDUM: Further review of this study does show mild dilatation of the central intrahepatic bile ducts and common hepatic duct which measures approximately 8 mm. The common bile duct does not appear dilated distal to this point, with suggestion of a transition zone in the distal common hepatic duct near the cystic duct insertion site. This shows slight shows mild wall thickening and enhancement (series 5009, image 48), but no definite obstructing calculus is visualized. This is suspicious for a stricture, which may be inflammatory in etiology, although neoplasm cannot definitely be excluded. Consider ERCP for further evaluation. These findings were discussed with Dr. Kendell Bane by telephone at the time of this addendum at 1100 hours on 2015/10/02. Electronically Signed   By: Myles Rosenthal M.D.   On: 02-Oct-2015 11:04  10-02-15  CLINICAL DATA:  Upper abdominal pain with cholelithiasis, distended gallbladder, and biliary dilatation seen on recent ultrasound and CT. EXAM: MRI ABDOMEN WITHOUT AND WITH CONTRAST (INCLUDING MRCP) TECHNIQUE: Multiplanar multisequence MR imaging of the abdomen was performed both before and after the administration of intravenous contrast. Heavily T2-weighted images of the biliary and pancreatic ducts were obtained,  and three-dimensional MRCP images were rendered by post processing. CONTRAST:  20mL MULTIHANCE GADOBENATE DIMEGLUMINE 529 MG/ML IV SOLN COMPARISON:  Ultrasound on 09/22/2015 and CT on 09/21/2015 FINDINGS: Lower chest:  Mild bibasilar atelectasis noted. Hepatobiliary: A tiny sub-cm cyst is seen in the posterior right hepatic lobe, however no liver masses are identified. The gallbladder is distended and contains 2 small less than 1 cm stones in the gallbladder neck. There is no evidence of gallbladder wall thickening, although there is minimal pericholecystic edema adjacent to the gallbladder neck, best seen on image 21 of series 4. This is suspicious for acute cholecystitis. Exam is partially degraded by motion artifact. There is no definite evidence of biliary ductal dilatation or choledocholithiasis. Pancreas: No mass, inflammatory changes, or other parenchymal abnormality identified. No evidence of pancreatic ductal dilatation. Spleen:  Within normal limits in size and appearance. Adrenals/Urinary Tract: No masses identified. No evidence of hydronephrosis. Stomach/Bowel: Tiny hiatal hernia noted. Visualized portions within the abdomen are unremarkable. Vascular/Lymphatic: No pathologically enlarged lymph nodes identified. No abdominal aortic aneurysm demonstrated. Other:  None. Musculoskeletal:  No suspicious bone lesions identified. IMPRESSION: Distended gallbladder with 2 tiny less than 1 cm gallstones in the gallbladder neck and mild pericholecystic edema, suspicious for acute cholecystitis. Recommend clinical correlation, and consider  nuclear medicine hepatobiliary imaging if clinically warranted. Exam degradation by motion artifact. No definite evidence of biliary ductal dilatation or choledocholithiasis. Mild bibasilar atelectasis. Tiny hiatal hernia. Electronically Signed: By: Myles Rosenthal M.D. On: 09/23/2015 09:52   Dg Ercp With Sphincterotomy  09/23/2015  CLINICAL DATA:  60 year old male status post  ERCP with sphincterotomy EXAM: ERCP TECHNIQUE: Multiple spot images obtained with the fluoroscopic device and submitted for interpretation post-procedure. FLUOROSCOPY TIME:  If the device does not provide the exposure index: Fluoroscopy Time:  3 minutes 14 seconds reported COMPARISON:  MRCP 09/23/2015 FINDINGS: A total of 6 intraoperative spot images in for sending clips are submitted. The images demonstrate a flexible endoscope in the descending duodenum followed by cannulation of the common bile duct. Opacification of the biliary tree demonstrates mild dilatation of the common bile duct with several filling defects concerning for choledocholithiasis. The subsequent images show sphincterotomy and balloon sweeping of the common duct and finally placement of a plastic biliary stent. IMPRESSION: 1. ERCP demonstrates mild dilatation of the common bile duct with filling defects concerning for possible choledocholithiasis. 2. Subsequently sphincterotomy and balloon sweep of the duct are performed followed by placement of a plastic biliary stent. These images were submitted for radiologic interpretation only. Please see the procedural report for the amount of contrast and the fluoroscopy time utilized. Electronically Signed   By: Malachy Moan M.D.   On: 09/23/2015 16:18   Mr Roe Coombs W/wo Cm/mrcp  09/23/2015  ADDENDUM REPORT: 09/23/2015 11:04 ADDENDUM: Further review of this study does show mild dilatation of the central intrahepatic bile ducts and common hepatic duct which measures approximately 8 mm. The common bile duct does not appear dilated distal to this point, with suggestion of a transition zone in the distal common hepatic duct near the cystic duct insertion site. This shows slight shows mild wall thickening and enhancement (series 5009, image 48), but no definite obstructing calculus is visualized. This is suspicious for a stricture, which may be inflammatory in etiology, although neoplasm cannot definitely  be excluded. Consider ERCP for further evaluation. These findings were discussed with Dr. Kendell Bane by telephone at the time of this addendum at 1100 hours on 09/23/2015. Electronically Signed   By: Myles Rosenthal M.D.   On: 09/23/2015 11:04  09/23/2015  CLINICAL DATA:  Upper abdominal pain with cholelithiasis, distended gallbladder, and biliary dilatation seen on recent ultrasound and CT. EXAM: MRI ABDOMEN WITHOUT AND WITH CONTRAST (INCLUDING MRCP) TECHNIQUE: Multiplanar multisequence MR imaging of the abdomen was performed both before and after the administration of intravenous contrast. Heavily T2-weighted images of the biliary and pancreatic ducts were obtained, and three-dimensional MRCP images were rendered by post processing. CONTRAST:  20mL MULTIHANCE GADOBENATE DIMEGLUMINE 529 MG/ML IV SOLN COMPARISON:  Ultrasound on 09/22/2015 and CT on 09/21/2015 FINDINGS: Lower chest:  Mild bibasilar atelectasis noted. Hepatobiliary: A tiny sub-cm cyst is seen in the posterior right hepatic lobe, however no liver masses are identified. The gallbladder is distended and contains 2 small less than 1 cm stones in the gallbladder neck. There is no evidence of gallbladder wall thickening, although there is minimal pericholecystic edema adjacent to the gallbladder neck, best seen on image 21 of series 4. This is suspicious for acute cholecystitis. Exam is partially degraded by motion artifact. There is no definite evidence of biliary ductal dilatation or choledocholithiasis. Pancreas: No mass, inflammatory changes, or other parenchymal abnormality identified. No evidence of pancreatic ductal dilatation. Spleen:  Within normal limits in size and appearance. Adrenals/Urinary Tract:  No masses identified. No evidence of hydronephrosis. Stomach/Bowel: Tiny hiatal hernia noted. Visualized portions within the abdomen are unremarkable. Vascular/Lymphatic: No pathologically enlarged lymph nodes identified. No abdominal aortic aneurysm  demonstrated. Other:  None. Musculoskeletal:  No suspicious bone lesions identified. IMPRESSION: Distended gallbladder with 2 tiny less than 1 cm gallstones in the gallbladder neck and mild pericholecystic edema, suspicious for acute cholecystitis. Recommend clinical correlation, and consider nuclear medicine hepatobiliary imaging if clinically warranted. Exam degradation by motion artifact. No definite evidence of biliary ductal dilatation or choledocholithiasis. Mild bibasilar atelectasis. Tiny hiatal hernia. Electronically Signed: By: Myles Rosenthal M.D. On: 09/23/2015 09:52    Scheduled Meds: . carvedilol  12.5 mg Oral BID WC  . chlorhexidine  1 application Topical Once  . ciprofloxacin  400 mg Intravenous On Call to OR  . Influenza vac split quadrivalent PF  0.5 mL Intramuscular Tomorrow-1000  . pantoprazole (PROTONIX) IV  40 mg Intravenous Q24H   Continuous Infusions: . sodium chloride 75 mL/hr at 09/23/15 2147    Principal Problem:   Choledocholithiasis Active Problems:   Essential hypertension   Hypertensive urgency   Abdominal pain, right upper quadrant   Transaminitis   GERD (gastroesophageal reflux disease)   Obesity   Abdominal pain   Epigastric pain   Elevated liver enzymes   Elevated LFTs   Biliary stricture    Time spent: 30 minutes   Vassie Loll  [facility] Pager [phone]. If 7PM-7AM, please contact night-coverage at [email or web address], password [facility] 09/24/2015, 1:35 PM  LOS: 3 days

## 2015-09-24 NOTE — Telephone Encounter (Signed)
Currently inpatient. Please arrange hospital follow-up with me in 3-4 weeks to discuss repeat ERCP.

## 2015-09-24 NOTE — Addendum Note (Signed)
Addendum  created 09/24/15 1325 by Franco Nones, CRNA   Modules edited: Notes Section   Notes Section:  File: [id number]

## 2015-09-24 NOTE — Consult Note (Signed)
Reason for Consult: Cholecystitis, cholelithiasis Referring Physician: Hospitalist  Blair Lundeen is an 60 y.o. male.  HPI: Patient is a 60 year old white male who was admitted to the hospital with worsening upper abdominal pain. He was found to have cholecystitis with cholelithiasis. He had significantly elevated LFTs as well as a total bilirubin. He underwent ERCP with stent placement yesterday by Dr. Gala Romney and was found to have choledocholithiasis. The stone could not be removed. He also has a gallstone in the neck of the gallbladder. Since the ERCP, he feels significantly better. No fever or chills are noted.  History reviewed. No pertinent past medical history.  Past Surgical History  Procedure Laterality Date  . Foot foreign body removal Right     as a child    History reviewed. No pertinent family history.  Social History:  reports that he has never smoked. He does not have any smokeless tobacco history on file. He reports that he does not drink alcohol. His drug history is not on file.  Allergies:  Allergies  Allergen Reactions  . Advil [Ibuprofen]     Spent 8 days in [facility]- due to [place] Johnson's syndrome and tachycardia  . Asa [Aspirin]     Steven-Johnson syndrome and tachycardia    Medications: I have reviewed the patient's current medications.  Results for orders placed or performed during the hospital encounter of 09/21/15 (from the past 48 hour(s))  Comprehensive metabolic panel     Status: Abnormal   Collection Time: 09/23/15  5:25 AM  Result Value Ref Range   Sodium 136 135 - 145 mmol/L   Potassium 3.9 3.5 - 5.1 mmol/L   Chloride 104 101 - 111 mmol/L   CO2 24 22 - 32 mmol/L   Glucose, Bld 91 65 - 99 mg/dL   BUN 13 6 - 20 mg/dL   Creatinine, Ser 0.61 0.61 - 1.24 mg/dL   Calcium 8.2 (L) 8.9 - 10.3 mg/dL   Total Protein 6.4 (L) 6.5 - 8.1 g/dL   Albumin 3.4 (L) 3.5 - 5.0 g/dL   AST 236 (H) 15 - 41 U/L   ALT 972 (H) 17 - 63 U/L   Alkaline Phosphatase 152 (H)  38 - 126 U/L   Total Bilirubin 8.5 (H) 0.3 - 1.2 mg/dL   GFR calc non Af Amer >60 >60 mL/min   GFR calc Af Amer >60 >60 mL/min    Comment: (NOTE) The eGFR has been calculated using the CKD EPI equation. This calculation has not been validated in all clinical situations. eGFR's persistently <60 mL/min signify possible Chronic Kidney Disease.    Anion gap 8 5 - 15  Hepatic function panel     Status: Abnormal   Collection Time: 09/24/15  5:32 AM  Result Value Ref Range   Total Protein 6.3 (L) 6.5 - 8.1 g/dL   Albumin 3.3 (L) 3.5 - 5.0 g/dL   AST 153 (H) 15 - 41 U/L   ALT 727 (H) 17 - 63 U/L   Alkaline Phosphatase 163 (H) 38 - 126 U/L   Total Bilirubin 5.5 (H) 0.3 - 1.2 mg/dL   Bilirubin, Direct 3.1 (H) 0.1 - 0.5 mg/dL   Indirect Bilirubin 2.4 (H) 0.3 - 0.9 mg/dL    Mr 3d Recon At Scanner  09/23/2015  ADDENDUM REPORT: 09/23/2015 11:04 ADDENDUM: Further review of this study does show mild dilatation of the central intrahepatic bile ducts and common hepatic duct which measures approximately 8 mm. The common bile duct does not appear dilated  distal to this point, with suggestion of a transition zone in the distal common hepatic duct near the cystic duct insertion site. This shows slight shows mild wall thickening and enhancement (series 5009, image 48), but no definite obstructing calculus is visualized. This is suspicious for a stricture, which may be inflammatory in etiology, although neoplasm cannot definitely be excluded. Consider ERCP for further evaluation. These findings were discussed with Dr. Sydell Axon by telephone at the time of this addendum at 1100 hours on 09/23/2015. Electronically Signed   By: Earle Gell M.D.   On: 09/23/2015 11:04  09/23/2015  CLINICAL DATA:  Upper abdominal pain with cholelithiasis, distended gallbladder, and biliary dilatation seen on recent ultrasound and CT. EXAM: MRI ABDOMEN WITHOUT AND WITH CONTRAST (INCLUDING MRCP) TECHNIQUE: Multiplanar multisequence MR  imaging of the abdomen was performed both before and after the administration of intravenous contrast. Heavily T2-weighted images of the biliary and pancreatic ducts were obtained, and three-dimensional MRCP images were rendered by post processing. CONTRAST:  41m MULTIHANCE GADOBENATE DIMEGLUMINE 529 MG/ML IV SOLN COMPARISON:  Ultrasound on 09/22/2015 and CT on 09/21/2015 FINDINGS: Lower chest:  Mild bibasilar atelectasis noted. Hepatobiliary: A tiny sub-cm cyst is seen in the posterior right hepatic lobe, however no liver masses are identified. The gallbladder is distended and contains 2 small less than 1 cm stones in the gallbladder neck. There is no evidence of gallbladder wall thickening, although there is minimal pericholecystic edema adjacent to the gallbladder neck, best seen on image 21 of series 4. This is suspicious for acute cholecystitis. Exam is partially degraded by motion artifact. There is no definite evidence of biliary ductal dilatation or choledocholithiasis. Pancreas: No mass, inflammatory changes, or other parenchymal abnormality identified. No evidence of pancreatic ductal dilatation. Spleen:  Within normal limits in size and appearance. Adrenals/Urinary Tract: No masses identified. No evidence of hydronephrosis. Stomach/Bowel: Tiny hiatal hernia noted. Visualized portions within the abdomen are unremarkable. Vascular/Lymphatic: No pathologically enlarged lymph nodes identified. No abdominal aortic aneurysm demonstrated. Other:  None. Musculoskeletal:  No suspicious bone lesions identified. IMPRESSION: Distended gallbladder with 2 tiny less than 1 cm gallstones in the gallbladder neck and mild pericholecystic edema, suspicious for acute cholecystitis. Recommend clinical correlation, and consider nuclear medicine hepatobiliary imaging if clinically warranted. Exam degradation by motion artifact. No definite evidence of biliary ductal dilatation or choledocholithiasis. Mild bibasilar  atelectasis. Tiny hiatal hernia. Electronically Signed: By: JEarle GellM.D. On: 09/23/2015 09:52   Dg Ercp With Sphincterotomy  09/23/2015  CLINICAL DATA:  60year old male status post ERCP with sphincterotomy EXAM: ERCP TECHNIQUE: Multiple spot images obtained with the fluoroscopic device and submitted for interpretation post-procedure. FLUOROSCOPY TIME:  If the device does not provide the exposure index: Fluoroscopy Time:  3 minutes 14 seconds reported COMPARISON:  MRCP 09/23/2015 FINDINGS: A total of 6 intraoperative spot images in for sending clips are submitted. The images demonstrate a flexible endoscope in the descending duodenum followed by cannulation of the common bile duct. Opacification of the biliary tree demonstrates mild dilatation of the common bile duct with several filling defects concerning for choledocholithiasis. The subsequent images show sphincterotomy and balloon sweeping of the common duct and finally placement of a plastic biliary stent. IMPRESSION: 1. ERCP demonstrates mild dilatation of the common bile duct with filling defects concerning for possible choledocholithiasis. 2. Subsequently sphincterotomy and balloon sweep of the duct are performed followed by placement of a plastic biliary stent. These images were submitted for radiologic interpretation only. Please see the procedural report for  the amount of contrast and the fluoroscopy time utilized. Electronically Signed   By: Malachy Moan M.D.   On: 09/23/2015 16:18   Mr Roe Coombs W/wo Cm/mrcp  09/23/2015  ADDENDUM REPORT: 09/23/2015 11:04 ADDENDUM: Further review of this study does show mild dilatation of the central intrahepatic bile ducts and common hepatic duct which measures approximately 8 mm. The common bile duct does not appear dilated distal to this point, with suggestion of a transition zone in the distal common hepatic duct near the cystic duct insertion site. This shows slight shows mild wall thickening and enhancement  (series 5009, image 48), but no definite obstructing calculus is visualized. This is suspicious for a stricture, which may be inflammatory in etiology, although neoplasm cannot definitely be excluded. Consider ERCP for further evaluation. These findings were discussed with Dr. Kendell Bane by telephone at the time of this addendum at 1100 hours on 09/23/2015. Electronically Signed   By: Myles Rosenthal M.D.   On: 09/23/2015 11:04  09/23/2015  CLINICAL DATA:  Upper abdominal pain with cholelithiasis, distended gallbladder, and biliary dilatation seen on recent ultrasound and CT. EXAM: MRI ABDOMEN WITHOUT AND WITH CONTRAST (INCLUDING MRCP) TECHNIQUE: Multiplanar multisequence MR imaging of the abdomen was performed both before and after the administration of intravenous contrast. Heavily T2-weighted images of the biliary and pancreatic ducts were obtained, and three-dimensional MRCP images were rendered by post processing. CONTRAST:  66mL MULTIHANCE GADOBENATE DIMEGLUMINE 529 MG/ML IV SOLN COMPARISON:  Ultrasound on 09/22/2015 and CT on 09/21/2015 FINDINGS: Lower chest:  Mild bibasilar atelectasis noted. Hepatobiliary: A tiny sub-cm cyst is seen in the posterior right hepatic lobe, however no liver masses are identified. The gallbladder is distended and contains 2 small less than 1 cm stones in the gallbladder neck. There is no evidence of gallbladder wall thickening, although there is minimal pericholecystic edema adjacent to the gallbladder neck, best seen on image 21 of series 4. This is suspicious for acute cholecystitis. Exam is partially degraded by motion artifact. There is no definite evidence of biliary ductal dilatation or choledocholithiasis. Pancreas: No mass, inflammatory changes, or other parenchymal abnormality identified. No evidence of pancreatic ductal dilatation. Spleen:  Within normal limits in size and appearance. Adrenals/Urinary Tract: No masses identified. No evidence of hydronephrosis. Stomach/Bowel:  Tiny hiatal hernia noted. Visualized portions within the abdomen are unremarkable. Vascular/Lymphatic: No pathologically enlarged lymph nodes identified. No abdominal aortic aneurysm demonstrated. Other:  None. Musculoskeletal:  No suspicious bone lesions identified. IMPRESSION: Distended gallbladder with 2 tiny less than 1 cm gallstones in the gallbladder neck and mild pericholecystic edema, suspicious for acute cholecystitis. Recommend clinical correlation, and consider nuclear medicine hepatobiliary imaging if clinically warranted. Exam degradation by motion artifact. No definite evidence of biliary ductal dilatation or choledocholithiasis. Mild bibasilar atelectasis. Tiny hiatal hernia. Electronically Signed: By: Myles Rosenthal M.D. On: 09/23/2015 09:52   US Abdomen Limited Ruq  09/22/2015  CLINICAL DATA:  Right upper quadrant epigastric pain for 2 days. Distended gallbladder and ducts on CT. EXAM: US ABDOMEN LIMITED - RIGHT UPPER QUADRANT COMPARISON:  CT 09/21/2015 FINDINGS: Gallbladder: The gallbladder is distended. There is a small echogenic focus within the gallbladder neck measuring 12 mm. No wall thickening. Negative sonographic Murphy's. Common bile duct: Diameter: Mildly dilated, measuring 9-10 mm. The distal duct is not visualized due to overlying bowel gas. Liver: Mildly increased echotexture suggesting fatty infiltration. No visible intrahepatic biliary ductal dilatation. No focal abnormality. IMPRESSION: Mild distention of the gallbladder. There appears to be a 12 mm stone  in the region of the gallbladder neck which is non mobile. No wall thickening. Negative sonographic Murphy's. Common bile duct is dilated, nearly 10 mm, but the distal duct cannot be visualized. May consider further evaluation with MRCP. Electronically Signed   By: Rolm Baptise M.D.   On: 09/22/2015 10:28    ROS: See chart Blood pressure 165/98, pulse 67, temperature 98.5 F (36.9 C), temperature source Oral, resp. rate 20,  height _0  (1.854 m), weight 128.822 kg (284 lb), SpO2 94 %. Physical Exam: Pleasant white male in no acute distress. Abdomen is soft, nontender, nondistended. No hepatosplenomegaly, masses, or hernias are identified. Labs reviewed.  Assessment/Plan: Impression: Cholecystitis, cholelithiasis, choledocholithiasis Plan: Agree with proceeding with laparoscopic cholecystectomy. This is being scheduled for tomorrow. Risks and benefits of the procedure including bleeding, infection, hepatobiliary injury, and the possibility of an open procedure were fully explained to the patient, who gave informed consent.  Ayriana Wix A 09/24/2015, 9:00 AM

## 2015-09-24 NOTE — Telephone Encounter (Signed)
APPOINTMENT MADE AND PATRICE ON 300 WILL LET PATIENT KNOW.

## 2015-09-24 NOTE — Anesthesia Postprocedure Evaluation (Signed)
Anesthesia Post Note  Patient: Joe Kidd  Procedure(s) Performed: Procedure(s) (LRB): ENDOSCOPIC RETROGRADE CHOLANGIOPANCREATOGRAPHY (ERCP) (N/A) SPHINCTEROTOMY (N/A) BALLOON DILATION (N/A) BILIARY STENT PLACEMENT (N/A)  Patient location during evaluation: Nursing Unit Anesthesia Type: General Level of consciousness: awake and alert Pain management: pain level controlled Vital Signs Assessment: post-procedure vital signs reviewed and stable Respiratory status: spontaneous breathing Cardiovascular status: stable Anesthetic complications: no    Last Vitals:  Filed Vitals:   09/24/15 0541 09/24/15 1309  BP: 165/98 141/86  Pulse: 67 75  Temp: 36.9 C 37.1 C  Resp: 20 20    Last Pain:  Filed Vitals:   09/24/15 1310  PainSc: 0-No pain                 Trevion Hoben

## 2015-09-24 NOTE — Progress Notes (Signed)
REVIEWED-NO ADDITIONAL RECOMMENDATIONS.   Subjective: No abdominal pain, no N/V. Feels improved. Multiple questions answered regarding hospital course and follow-up. No GI complaints today. Would like to advance diet.   Objective: Vital signs in last 24 hours: Temp:  [97.5 F (36.4 C)-98.5 F (36.9 C)] 98.5 F (36.9 C) (02/28 0541) Pulse Rate:  [67-95] 67 (02/28 0541) Resp:  [17-24] 20 (02/28 0541) BP: (133-165)/(78-105) 165/98 mmHg (02/28 0541) SpO2:  [94 %-100 %] 94 % (02/28 0541) Last BM Date: 09/22/15 General:   Alert and oriented, pleasant Head:  Normocephalic and atraumatic. Eyes:  No icterus, sclera clear. Conjuctiva pink.  Abdomen:  Bowel sounds present, soft, non-tender, non-distended. Limited exam with patient sitting up in chart eating breakfast Msk:  Symmetrical without gross deformities. Normal posture. Extremities:  Without  edema. Neurologic:  Alert and  oriented x4;  grossly normal neurologically. Psych:  Alert and cooperative. Normal mood and affect.  Intake/Output from previous day: 02/27 0701 - 02/28 0700 In: 1540 [P.O.:840; I.V.:700] Out: 0  Intake/Output this shift:    Lab Results:  Recent Labs  09/21/15 1137 09/22/15 0616  WBC 7.1 6.2  HGB 17.2* 15.6  HCT 51.5 47.5  PLT 215 208   BMET  Recent Labs  09/21/15 1137 09/23/15 0525  NA 137 136  K 4.3 3.9  CL 101 104  CO2 28 24  GLUCOSE 111* 91  BUN 15 13  CREATININE 0.89 0.61  CALCIUM 9.2 8.2*   LFT  Recent Labs  09/22/15 0616 09/23/15 0525 09/24/15 0532  PROT 6.9 6.4* 6.3*  ALBUMIN 3.7 3.4* 3.3*  AST 510* 236* 153*  ALT 1433* 972* 727*  ALKPHOS 145* 152* 163*  BILITOT 7.8* 8.5* 5.5*  BILIDIR 4.6*  --  3.1*  IBILI 3.2*  --  2.4*   PT/INR  Recent Labs  09/22/15 0616  LABPROT 14.5  INR 1.11   Hepatitis Panel  Recent Labs  09/21/15 1137  HEPBSAG Negative  HCVAB <0.1  HEPAIGM Negative  HEPBIGM Negative     Studies/Results: Mr 3d Recon At Scanner  09/23/2015   ADDENDUM REPORT: 09/23/2015 11:04 ADDENDUM: Further review of this study does show mild dilatation of the central intrahepatic bile ducts and common hepatic duct which measures approximately 8 mm. The common bile duct does not appear dilated distal to this point, with suggestion of a transition zone in the distal common hepatic duct near the cystic duct insertion site. This shows slight shows mild wall thickening and enhancement (series 5009, image 48), but no definite obstructing calculus is visualized. This is suspicious for a stricture, which may be inflammatory in etiology, although neoplasm cannot definitely be excluded. Consider ERCP for further evaluation. These findings were discussed with Dr. Kendell Bane by telephone at the time of this addendum at 1100 hours on 09/23/2015. Electronically Signed   By: Myles Rosenthal M.D.   On: 09/23/2015 11:04  09/23/2015  CLINICAL DATA:  Upper abdominal pain with cholelithiasis, distended gallbladder, and biliary dilatation seen on recent ultrasound and CT. EXAM: MRI ABDOMEN WITHOUT AND WITH CONTRAST (INCLUDING MRCP) TECHNIQUE: Multiplanar multisequence MR imaging of the abdomen was performed both before and after the administration of intravenous contrast. Heavily T2-weighted images of the biliary and pancreatic ducts were obtained, and three-dimensional MRCP images were rendered by post processing. CONTRAST:  20mL MULTIHANCE GADOBENATE DIMEGLUMINE 529 MG/ML IV SOLN COMPARISON:  Ultrasound on 09/22/2015 and CT on 09/21/2015 FINDINGS: Lower chest:  Mild bibasilar atelectasis noted. Hepatobiliary: A tiny sub-cm cyst is seen in the  posterior right hepatic lobe, however no liver masses are identified. The gallbladder is distended and contains 2 small less than 1 cm stones in the gallbladder neck. There is no evidence of gallbladder wall thickening, although there is minimal pericholecystic edema adjacent to the gallbladder neck, best seen on image 21 of series 4. This is suspicious  for acute cholecystitis. Exam is partially degraded by motion artifact. There is no definite evidence of biliary ductal dilatation or choledocholithiasis. Pancreas: No mass, inflammatory changes, or other parenchymal abnormality identified. No evidence of pancreatic ductal dilatation. Spleen:  Within normal limits in size and appearance. Adrenals/Urinary Tract: No masses identified. No evidence of hydronephrosis. Stomach/Bowel: Tiny hiatal hernia noted. Visualized portions within the abdomen are unremarkable. Vascular/Lymphatic: No pathologically enlarged lymph nodes identified. No abdominal aortic aneurysm demonstrated. Other:  None. Musculoskeletal:  No suspicious bone lesions identified. IMPRESSION: Distended gallbladder with 2 tiny less than 1 cm gallstones in the gallbladder neck and mild pericholecystic edema, suspicious for acute cholecystitis. Recommend clinical correlation, and consider nuclear medicine hepatobiliary imaging if clinically warranted. Exam degradation by motion artifact. No definite evidence of biliary ductal dilatation or choledocholithiasis. Mild bibasilar atelectasis. Tiny hiatal hernia. Electronically Signed: By: Myles Rosenthal M.D. On: 09/23/2015 09:52   Dg Ercp With Sphincterotomy  09/23/2015  CLINICAL DATA:  60 year old male status post ERCP with sphincterotomy EXAM: ERCP TECHNIQUE: Multiple spot images obtained with the fluoroscopic device and submitted for interpretation post-procedure. FLUOROSCOPY TIME:  If the device does not provide the exposure index: Fluoroscopy Time:  3 minutes 14 seconds reported COMPARISON:  MRCP 09/23/2015 FINDINGS: A total of 6 intraoperative spot images in for sending clips are submitted. The images demonstrate a flexible endoscope in the descending duodenum followed by cannulation of the common bile duct. Opacification of the biliary tree demonstrates mild dilatation of the common bile duct with several filling defects concerning for choledocholithiasis.  The subsequent images show sphincterotomy and balloon sweeping of the common duct and finally placement of a plastic biliary stent. IMPRESSION: 1. ERCP demonstrates mild dilatation of the common bile duct with filling defects concerning for possible choledocholithiasis. 2. Subsequently sphincterotomy and balloon sweep of the duct are performed followed by placement of a plastic biliary stent. These images were submitted for radiologic interpretation only. Please see the procedural report for the amount of contrast and the fluoroscopy time utilized. Electronically Signed   By: Malachy Moan M.D.   On: 09/23/2015 16:18   Mr Roe Coombs W/wo Cm/mrcp  09/23/2015  ADDENDUM REPORT: 09/23/2015 11:04 ADDENDUM: Further review of this study does show mild dilatation of the central intrahepatic bile ducts and common hepatic duct which measures approximately 8 mm. The common bile duct does not appear dilated distal to this point, with suggestion of a transition zone in the distal common hepatic duct near the cystic duct insertion site. This shows slight shows mild wall thickening and enhancement (series 5009, image 48), but no definite obstructing calculus is visualized. This is suspicious for a stricture, which may be inflammatory in etiology, although neoplasm cannot definitely be excluded. Consider ERCP for further evaluation. These findings were discussed with Dr. Kendell Bane by telephone at the time of this addendum at 1100 hours on 09/23/2015. Electronically Signed   By: Myles Rosenthal M.D.   On: 09/23/2015 11:04  09/23/2015  CLINICAL DATA:  Upper abdominal pain with cholelithiasis, distended gallbladder, and biliary dilatation seen on recent ultrasound and CT. EXAM: MRI ABDOMEN WITHOUT AND WITH CONTRAST (INCLUDING MRCP) TECHNIQUE: Multiplanar multisequence MR imaging of  the abdomen was performed both before and after the administration of intravenous contrast. Heavily T2-weighted images of the biliary and pancreatic ducts were  obtained, and three-dimensional MRCP images were rendered by post processing. CONTRAST:  20mL MULTIHANCE GADOBENATE DIMEGLUMINE 529 MG/ML IV SOLN COMPARISON:  Ultrasound on 09/22/2015 and CT on 09/21/2015 FINDINGS: Lower chest:  Mild bibasilar atelectasis noted. Hepatobiliary: A tiny sub-cm cyst is seen in the posterior right hepatic lobe, however no liver masses are identified. The gallbladder is distended and contains 2 small less than 1 cm stones in the gallbladder neck. There is no evidence of gallbladder wall thickening, although there is minimal pericholecystic edema adjacent to the gallbladder neck, best seen on image 21 of series 4. This is suspicious for acute cholecystitis. Exam is partially degraded by motion artifact. There is no definite evidence of biliary ductal dilatation or choledocholithiasis. Pancreas: No mass, inflammatory changes, or other parenchymal abnormality identified. No evidence of pancreatic ductal dilatation. Spleen:  Within normal limits in size and appearance. Adrenals/Urinary Tract: No masses identified. No evidence of hydronephrosis. Stomach/Bowel: Tiny hiatal hernia noted. Visualized portions within the abdomen are unremarkable. Vascular/Lymphatic: No pathologically enlarged lymph nodes identified. No abdominal aortic aneurysm demonstrated. Other:  None. Musculoskeletal:  No suspicious bone lesions identified. IMPRESSION: Distended gallbladder with 2 tiny less than 1 cm gallstones in the gallbladder neck and mild pericholecystic edema, suspicious for acute cholecystitis. Recommend clinical correlation, and consider nuclear medicine hepatobiliary imaging if clinically warranted. Exam degradation by motion artifact. No definite evidence of biliary ductal dilatation or choledocholithiasis. Mild bibasilar atelectasis. Tiny hiatal hernia. Electronically Signed: By: Myles Rosenthal M.D. On: 09/23/2015 09:52   US Abdomen Limited Ruq  09/22/2015  CLINICAL DATA:  Right upper quadrant  epigastric pain for 2 days. Distended gallbladder and ducts on CT. EXAM: US ABDOMEN LIMITED - RIGHT UPPER QUADRANT COMPARISON:  CT 09/21/2015 FINDINGS: Gallbladder: The gallbladder is distended. There is a small echogenic focus within the gallbladder neck measuring 12 mm. No wall thickening. Negative sonographic Murphy's. Common bile duct: Diameter: Mildly dilated, measuring 9-10 mm. The distal duct is not visualized due to overlying bowel gas. Liver: Mildly increased echotexture suggesting fatty infiltration. No visible intrahepatic biliary ductal dilatation. No focal abnormality. IMPRESSION: Mild distention of the gallbladder. There appears to be a 12 mm stone in the region of the gallbladder neck which is non mobile. No wall thickening. Negative sonographic Murphy's. Common bile duct is dilated, nearly 10 mm, but the distal duct cannot be visualized. May consider further evaluation with MRCP. Electronically Signed   By: Charlett Nose M.D.   On: 09/22/2015 10:28    Assessment: 60 year old male with elevated LFTs, partial biliary obstruction in setting of acute cholecystitis. ERCP with sphincterotomy on 2/27 notes compression of bile duct likely extrinsic due to markedly inflamed distended gallbladder, attempted removal of common hepatic duct stone but unable to pull through narrowed common bile duct segment. Plastic stent placed across bile duct. Cytology brushings obtained. Needs surgical evaluation for cholecystectomy with repeat outpatient ERCP as outpatient. I have notified Dr. Lovell Sheehan. LFTs improved from yesterday. Patient without GI complaints; he would like to advance diet. Would recommend low fat diet for now but I informed he would be NPO prior to surgery. Stated understanding. Will arrange office visit in hospital follow-up to set up repeat ERCP for stent and stone removal.   Plan: Will await surgery recommendations then advance to soft, low-fat diet Surgical consult placed, notified Dr. Lovell Sheehan  personally Will arrange outpatient appt to  set up ERCP  Follow peripherally  Nira Retort, ANP-BC [facility]    LOS: 3 days    09/24/2015, 8:03 AM

## 2015-09-25 ENCOUNTER — Encounter (HOSPITAL_COMMUNITY): Payer: Self-pay | Admitting: *Deleted

## 2015-09-25 ENCOUNTER — Inpatient Hospital Stay (HOSPITAL_COMMUNITY): Payer: 59 | Admitting: Anesthesiology

## 2015-09-25 ENCOUNTER — Encounter (HOSPITAL_COMMUNITY)
Admission: EM | Disposition: A | Discharge status: Home / Self Care | Payer: Self-pay | Source: Home / Self Care | Attending: Internal Medicine

## 2015-09-25 DIAGNOSIS — K8021 Calculus of gallbladder without cholecystitis with obstruction: Secondary | ICD-10-CM

## 2015-09-25 DIAGNOSIS — R1011 Right upper quadrant pain: Secondary | ICD-10-CM

## 2015-09-25 HISTORY — PX: CHOLECYSTECTOMY: SHX55

## 2015-09-25 LAB — HEPATIC FUNCTION PANEL
ALK PHOS: 153 U/L — AB (ref 38–126)
ALT: 509 U/L — AB (ref 17–63)
AST: 86 U/L — ABNORMAL HIGH (ref 15–41)
Albumin: 3.3 g/dL — ABNORMAL LOW (ref 3.5–5.0)
BILIRUBIN INDIRECT: 1.6 mg/dL — AB (ref 0.3–0.9)
Bilirubin, Direct: 1.3 mg/dL — ABNORMAL HIGH (ref 0.1–0.5)
TOTAL PROTEIN: 6.4 g/dL — AB (ref 6.5–8.1)
Total Bilirubin: 2.9 mg/dL — ABNORMAL HIGH (ref 0.3–1.2)

## 2015-09-25 SURGERY — LAPAROSCOPIC CHOLECYSTECTOMY
Anesthesia: General

## 2015-09-25 MED ORDER — CIPROFLOXACIN IN D5W 400 MG/200ML IV SOLN
INTRAVENOUS | Status: DC | PRN
  Administered 2015-09-25: 400 mg via INTRAVENOUS

## 2015-09-25 MED ORDER — BUPIVACAINE HCL (PF) 0.5 % IJ SOLN
INTRAMUSCULAR | Status: AC
  Filled 2015-09-25: qty 30

## 2015-09-25 MED ORDER — LIDOCAINE HCL (CARDIAC) 20 MG/ML IV SOLN
INTRAVENOUS | Status: DC | PRN
  Administered 2015-09-25: 150 mg via INTRAVENOUS
  Administered 2015-09-25: 50 mg via INTRAVENOUS

## 2015-09-25 MED ORDER — DIPHENHYDRAMINE HCL 25 MG PO CAPS: 25.0000 mg | ORAL_CAPSULE | Freq: Four times a day (QID) | ORAL | Status: DC | PRN

## 2015-09-25 MED ORDER — PROPOFOL 10 MG/ML IV BOLUS
INTRAVENOUS | Status: AC
  Filled 2015-09-25: qty 20

## 2015-09-25 MED ORDER — MIDAZOLAM HCL 5 MG/5ML IJ SOLN
INTRAMUSCULAR | Status: DC | PRN
  Administered 2015-09-25: 2 mg via INTRAVENOUS

## 2015-09-25 MED ORDER — FENTANYL CITRATE (PF) 100 MCG/2ML IJ SOLN
INTRAMUSCULAR | Status: AC
  Filled 2015-09-25: qty 2

## 2015-09-25 MED ORDER — ONDANSETRON HCL 4 MG/2ML IJ SOLN
4.0000 mg | Freq: Once | INTRAMUSCULAR | Status: AC
  Administered 2015-09-25: 4 mg via INTRAVENOUS

## 2015-09-25 MED ORDER — ROCURONIUM BROMIDE 100 MG/10ML IV SOLN
INTRAVENOUS | Status: DC | PRN
  Administered 2015-09-25: 5 mg via INTRAVENOUS
  Administered 2015-09-25: 10 mg via INTRAVENOUS
  Administered 2015-09-25 (×2): 5 mg via INTRAVENOUS
  Administered 2015-09-25: 20 mg via INTRAVENOUS
  Administered 2015-09-25 (×2): 5 mg via INTRAVENOUS

## 2015-09-25 MED ORDER — HYDRALAZINE HCL 20 MG/ML IJ SOLN
INTRAMUSCULAR | Status: AC
  Filled 2015-09-25: qty 1

## 2015-09-25 MED ORDER — EPHEDRINE SULFATE 50 MG/ML IJ SOLN
INTRAMUSCULAR | Status: AC
  Filled 2015-09-25: qty 1

## 2015-09-25 MED ORDER — MIDAZOLAM HCL 2 MG/2ML IJ SOLN
INTRAMUSCULAR | Status: AC
  Filled 2015-09-25: qty 2

## 2015-09-25 MED ORDER — POVIDONE-IODINE 10 % EX OINT
TOPICAL_OINTMENT | CUTANEOUS | Status: AC
  Filled 2015-09-25: qty 1

## 2015-09-25 MED ORDER — SODIUM CHLORIDE 0.9 % IJ SOLN
INTRAMUSCULAR | Status: AC
  Filled 2015-09-25: qty 10

## 2015-09-25 MED ORDER — ONDANSETRON HCL 4 MG/2ML IJ SOLN: 4.0000 mg | Freq: Four times a day (QID) | INTRAMUSCULAR | Status: DC | PRN

## 2015-09-25 MED ORDER — LACTATED RINGERS IV SOLN
INTRAVENOUS | Status: DC
  Administered 2015-09-25 (×2): via INTRAVENOUS

## 2015-09-25 MED ORDER — HYDROMORPHONE HCL 1 MG/ML IJ SOLN
1.0000 mg | INTRAMUSCULAR | Status: DC | PRN
  Administered 2015-09-25: 1 mg via INTRAVENOUS
  Filled 2015-09-25: qty 1

## 2015-09-25 MED ORDER — FENTANYL CITRATE (PF) 250 MCG/5ML IJ SOLN
INTRAMUSCULAR | Status: AC
  Filled 2015-09-25: qty 5

## 2015-09-25 MED ORDER — ENOXAPARIN SODIUM 40 MG/0.4ML ~~LOC~~ SOLN
40.0000 mg | SUBCUTANEOUS | Status: DC
  Filled 2015-09-25 (×2): qty 0.4

## 2015-09-25 MED ORDER — GLYCOPYRROLATE 0.2 MG/ML IJ SOLN
INTRAMUSCULAR | Status: DC | PRN
  Administered 2015-09-25: 0.6 mg via INTRAVENOUS

## 2015-09-25 MED ORDER — ONDANSETRON 4 MG PO TBDP: 4.0000 mg | ORAL_TABLET | Freq: Four times a day (QID) | ORAL | Status: DC | PRN

## 2015-09-25 MED ORDER — ENALAPRILAT 1.25 MG/ML IV SOLN
1.2500 mg | Freq: Once | INTRAVENOUS | Status: AC
  Administered 2015-09-25: 1.25 mg via INTRAVENOUS

## 2015-09-25 MED ORDER — FENTANYL CITRATE (PF) 100 MCG/2ML IJ SOLN
INTRAMUSCULAR | Status: DC | PRN
  Administered 2015-09-25 (×9): 50 ug via INTRAVENOUS

## 2015-09-25 MED ORDER — NEOSTIGMINE METHYLSULFATE 10 MG/10ML IV SOLN
INTRAVENOUS | Status: DC | PRN
  Administered 2015-09-25: 4 mg via INTRAVENOUS

## 2015-09-25 MED ORDER — MIDAZOLAM HCL 2 MG/2ML IJ SOLN
1.0000 mg | INTRAMUSCULAR | Status: DC | PRN
  Administered 2015-09-25 (×2): 2 mg via INTRAVENOUS

## 2015-09-25 MED ORDER — MAGNESIUM HYDROXIDE 400 MG/5ML PO SUSP: 30.0000 mL | Freq: Every day | ORAL | Status: DC | PRN

## 2015-09-25 MED ORDER — DIPHENHYDRAMINE HCL 50 MG/ML IJ SOLN: 25.0000 mg | Freq: Four times a day (QID) | INTRAMUSCULAR | Status: DC | PRN

## 2015-09-25 MED ORDER — HYDRALAZINE HCL 20 MG/ML IJ SOLN
10.0000 mg | Freq: Once | INTRAMUSCULAR | Status: AC
  Administered 2015-09-25: 10 mg via INTRAVENOUS

## 2015-09-25 MED ORDER — FENTANYL CITRATE (PF) 100 MCG/2ML IJ SOLN
25.0000 ug | INTRAMUSCULAR | Status: DC | PRN
  Administered 2015-09-25 (×4): 50 ug via INTRAVENOUS
  Filled 2015-09-25: qty 2

## 2015-09-25 MED ORDER — OXYCODONE-ACETAMINOPHEN 5-325 MG PO TABS
1.0000 | ORAL_TABLET | ORAL | Status: DC | PRN
  Administered 2015-09-25 – 2015-09-26 (×3): 2 via ORAL
  Filled 2015-09-25 (×3): qty 2

## 2015-09-25 MED ORDER — POVIDONE-IODINE 10 % OINT PACKET
TOPICAL_OINTMENT | CUTANEOUS | Status: DC | PRN
  Administered 2015-09-25: 1 via TOPICAL

## 2015-09-25 MED ORDER — GLYCOPYRROLATE 0.2 MG/ML IJ SOLN
INTRAMUSCULAR | Status: AC
  Filled 2015-09-25: qty 3

## 2015-09-25 MED ORDER — CIPROFLOXACIN IN D5W 400 MG/200ML IV SOLN
INTRAVENOUS | Status: AC
  Filled 2015-09-25: qty 200

## 2015-09-25 MED ORDER — ROCURONIUM BROMIDE 50 MG/5ML IV SOLN
INTRAVENOUS | Status: AC
  Filled 2015-09-25: qty 1

## 2015-09-25 MED ORDER — SODIUM CHLORIDE 0.9 % IR SOLN
Status: DC | PRN
  Administered 2015-09-25: 3000 mL
  Administered 2015-09-25: 1000 mL

## 2015-09-25 MED ORDER — CIPROFLOXACIN IN D5W 400 MG/200ML IV SOLN
400.0000 mg | Freq: Two times a day (BID) | INTRAVENOUS | Status: DC
  Administered 2015-09-25 – 2015-09-27 (×4): 400 mg via INTRAVENOUS
  Filled 2015-09-25 (×4): qty 200

## 2015-09-25 MED ORDER — METOPROLOL TARTRATE 1 MG/ML IV SOLN
5.0000 mg | Freq: Once | INTRAVENOUS | Status: AC
  Administered 2015-09-25: 5 mg via INTRAVENOUS

## 2015-09-25 MED ORDER — HEMOSTATIC AGENTS (NO CHARGE) OPTIME
TOPICAL | Status: DC | PRN
  Administered 2015-09-25: 3 via TOPICAL

## 2015-09-25 MED ORDER — SUCCINYLCHOLINE CHLORIDE 20 MG/ML IJ SOLN
INTRAMUSCULAR | Status: DC | PRN
  Administered 2015-09-25: 140 mg via INTRAVENOUS

## 2015-09-25 MED ORDER — ONDANSETRON HCL 4 MG/2ML IJ SOLN
4.0000 mg | Freq: Once | INTRAMUSCULAR | Status: AC | PRN
  Administered 2015-09-25: 4 mg via INTRAVENOUS
  Filled 2015-09-25: qty 2

## 2015-09-25 MED ORDER — METOPROLOL TARTRATE 1 MG/ML IV SOLN
INTRAVENOUS | Status: AC
  Filled 2015-09-25: qty 5

## 2015-09-25 MED ORDER — ONDANSETRON HCL 4 MG/2ML IJ SOLN
INTRAMUSCULAR | Status: AC
  Filled 2015-09-25: qty 2

## 2015-09-25 MED ORDER — ENALAPRILAT 1.25 MG/ML IV SOLN
INTRAVENOUS | Status: AC
  Filled 2015-09-25: qty 2

## 2015-09-25 MED ORDER — BUPIVACAINE HCL (PF) 0.5 % IJ SOLN
INTRAMUSCULAR | Status: DC | PRN
  Administered 2015-09-25: 10 mL

## 2015-09-25 MED ORDER — LORAZEPAM 2 MG/ML IJ SOLN: 1.0000 mg | INTRAMUSCULAR | Status: DC | PRN

## 2015-09-25 MED ORDER — LIDOCAINE HCL (PF) 1 % IJ SOLN
INTRAMUSCULAR | Status: AC
  Filled 2015-09-25: qty 5

## 2015-09-25 MED ORDER — ARTIFICIAL TEARS OP OINT
TOPICAL_OINTMENT | OPHTHALMIC | Status: AC
  Filled 2015-09-25: qty 3.5

## 2015-09-25 MED ORDER — SIMETHICONE 80 MG PO CHEW: 40.0000 mg | CHEWABLE_TABLET | Freq: Four times a day (QID) | ORAL | Status: DC | PRN

## 2015-09-25 SURGICAL SUPPLY — 50 items
APPLIER CLIP LAPSCP 10X32 DD (CLIP) ×3 IMPLANT
BAG HAMPER (MISCELLANEOUS) ×3 IMPLANT
CHLORAPREP W/TINT 26ML (MISCELLANEOUS) ×3 IMPLANT
CLOTH BEACON ORANGE TIMEOUT ST (SAFETY) ×3 IMPLANT
COVER LIGHT HANDLE STERIS (MISCELLANEOUS) ×6 IMPLANT
CUTTER FLEX LINEAR 45M (STAPLE) ×3 IMPLANT
DECANTER SPIKE VIAL GLASS SM (MISCELLANEOUS) ×3 IMPLANT
DISSECTOR BLUNT TIP ENDO 5MM (MISCELLANEOUS) ×3 IMPLANT
ELECT REM PT RETURN 9FT ADLT (ELECTROSURGICAL) ×3
ELECTRODE REM PT RTRN 9FT ADLT (ELECTROSURGICAL) ×1 IMPLANT
EVACUATOR DRAINAGE 10X20 100CC (DRAIN) ×1 IMPLANT
EVACUATOR SILICONE 100CC (DRAIN) ×2
FILTER SMOKE EVAC LAPAROSHD (FILTER) ×3 IMPLANT
FORMALIN 10 PREFIL 120ML (MISCELLANEOUS) ×3 IMPLANT
GLOVE BIOGEL PI IND STRL 7.0 (GLOVE) ×2 IMPLANT
GLOVE BIOGEL PI IND STRL 7.5 (GLOVE) ×1 IMPLANT
GLOVE BIOGEL PI INDICATOR 7.0 (GLOVE) ×4
GLOVE BIOGEL PI INDICATOR 7.5 (GLOVE) ×2
GLOVE ECLIPSE 6.5 STRL STRAW (GLOVE) ×6 IMPLANT
GLOVE SURG SS PI 7.5 STRL IVOR (GLOVE) ×3 IMPLANT
GOWN STRL REUS W/ TWL XL LVL3 (GOWN DISPOSABLE) ×1 IMPLANT
GOWN STRL REUS W/TWL LRG LVL3 (GOWN DISPOSABLE) ×6 IMPLANT
GOWN STRL REUS W/TWL XL LVL3 (GOWN DISPOSABLE) ×2
HEMOSTAT SNOW SURGICEL 2X4 (HEMOSTASIS) ×9 IMPLANT
INST SET LAPROSCOPIC AP (KITS) ×3 IMPLANT
IV NS IRRIG 3000ML ARTHROMATIC (IV SOLUTION) ×6 IMPLANT
KIT ROOM TURNOVER APOR (KITS) ×3 IMPLANT
MANIFOLD NEPTUNE II (INSTRUMENTS) ×3 IMPLANT
NEEDLE INSUFFLATION 14GA 120MM (NEEDLE) ×3 IMPLANT
NS IRRIG 1000ML POUR BTL (IV SOLUTION) ×3 IMPLANT
PACK LAP CHOLE LZT030E (CUSTOM PROCEDURE TRAY) ×3 IMPLANT
PAD ARMBOARD 7.5X6 YLW CONV (MISCELLANEOUS) ×3 IMPLANT
POUCH SPECIMEN RETRIEVAL 10MM (ENDOMECHANICALS) ×3 IMPLANT
RELOAD STAPLE TA45 3.5 REG BLU (ENDOMECHANICALS) ×3 IMPLANT
SET BASIN LINEN APH (SET/KITS/TRAYS/PACK) ×3 IMPLANT
SET TUBE IRRIG SUCTION NO TIP (IRRIGATION / IRRIGATOR) ×3 IMPLANT
SLEEVE ENDOPATH XCEL 5M (ENDOMECHANICALS) ×3 IMPLANT
SPONGE DRAIN TRACH 4X4 STRL 2S (GAUZE/BANDAGES/DRESSINGS) ×3 IMPLANT
SPONGE GAUZE 2X2 8PLY STER LF (GAUZE/BANDAGES/DRESSINGS) ×4
SPONGE GAUZE 2X2 8PLY STRL LF (GAUZE/BANDAGES/DRESSINGS) ×8 IMPLANT
STAPLER VISISTAT (STAPLE) ×3 IMPLANT
SUT ETHILON 3 0 FSL (SUTURE) ×3 IMPLANT
SUT VICRYL 0 UR6 27IN ABS (SUTURE) ×3 IMPLANT
TAPE CLOTH SURG 4X10 WHT LF (GAUZE/BANDAGES/DRESSINGS) ×3 IMPLANT
TROCAR ENDO BLADELESS 11MM (ENDOMECHANICALS) ×3 IMPLANT
TROCAR XCEL NON-BLD 5MMX100MML (ENDOMECHANICALS) ×3 IMPLANT
TROCAR XCEL UNIV SLVE 11M 100M (ENDOMECHANICALS) ×3 IMPLANT
TUBING INSUFFLATION (TUBING) ×3 IMPLANT
WARMER LAPAROSCOPE (MISCELLANEOUS) ×3 IMPLANT
YANKAUER SUCT 12FT TUBE ARGYLE (SUCTIONS) ×3 IMPLANT

## 2015-09-25 NOTE — Progress Notes (Signed)
Dr Benancio Deeds at bedside to check pt. BP 159/103. Order given.

## 2015-09-25 NOTE — Op Note (Signed)
Patient:  Joe Kidd  DOB:  September 25, 1955  MRN:  [id number]   Preop Diagnosis:  Cholecystitis, cholelithiasis, MIrrizzi's syndrome  Postop Diagnosis:  Same  Procedure:  Laparoscopic cholecystectomy  Surgeon:  Franky Macho, M.D.  Anes:  Gen. endotracheal  Indications:  Patient is a 60 year old white male status post ERCP with stent placement due to choledocholithiasis. 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 The patient now comes to the operating room for laparoscopic cholecystectomy. The risks and benefits of the procedure including bleeding, infection, hepatobiliary injury, and the possibility of an open procedure were fully explained to the patient, who gave informed consent.  Procedure note:  The patient is placed the supine position. After induction of general endotracheal anesthesia, the abdomen was prepped and draped using usual sterile technique with DuraPrep. Surgical site confirmation was performed.  A supraumbilical incision was made down to the fascia. A Veress needle was introduced into the abdominal cavity and confirmation of placement was done using the saline drop test. The abdomen was then insufflated to 16 mmHg pressure. An 11 mm trocar was introduced into the abdominal cavity under direct visualization without difficulty. The patient was placed in reverse Trendelenburg position and an additional 11 mm trocar was placed the epigastric region and 5 mm trochars were placed the right upper quadrant right flank regions. Liver was inspected and noted to be within normal limits. Gallbladder was noted to be significantly enlarged with a thickened gallbladder wall. This appeared more chronic in nature versus acutely edematous. A dome down retrograde approach was performed in order to facilitate a critical view of the triangle of Calot. There was significant inflammation in the triangle which made it unsafe to fully isolate the cystic  duct. It was at times even difficult to palpate the stent in the common bile duct, though its track could be identified. I elected to proceed with using an Endo standard GIA across the infundibulum as this appeared to be the safest maneuver. Consideration to opening the case was thought of, but it would not have added any better exposure than what I had. The gallbladder was then removed using an Endo Catch bag without difficulty. The gallbladder fossa was inspected and no abnormal bleeding or bile leakage was noted. Surgicel was placed in the gallbladder fossa. A #10 Jackson-Pratt drain was placed in this region and brought out through a 5 mm lateral trocar site. It was secured the skin level using 3-0 nylon interrupted suture. All fluid and air were then evacuated from the abdominal cavity prior to removal of the trochars.  All wounds were irrigated with normal saline. All wounds were injected with 0.5% Sensorcaine. The supraumbilical fascia was reapproximated using 0 Vicryl interrupted suture. All incisions were closed using staples. Betadine ointment and dressed a dressings were applied.  All tape and needle counts were correct the end of the procedure. Patient was extubated in the operating room and transferred to PACU in stable condition.  Complications:  None  EBL:  100 mL  Specimen:  Gallbladder  Drains: JP drain to subhepatic space

## 2015-09-25 NOTE — Anesthesia Preprocedure Evaluation (Addendum)
Anesthesia Evaluation  Patient identified by MRN, date of birth, ID band Patient awake    Reviewed: Allergy & Precautions, NPO status , Patient's Chart, lab work & pertinent test results, reviewed documented beta blocker date and time   Airway Mallampati: II  TM Distance: >3 FB Neck ROM: Full    Dental  (+) Teeth Intact, Dental Advisory Given   Pulmonary    Pulmonary exam normal        Cardiovascular hypertension, Pt. on medications and Pt. on home beta blockers Normal cardiovascular exam     Neuro/Psych    GI/Hepatic GERD  Controlled,  Endo/Other    Renal/GU      Musculoskeletal   Abdominal Normal abdominal exam  (+)   Peds  Hematology   Anesthesia Other Findings   Reproductive/Obstetrics                            Anesthesia Physical Anesthesia Plan  ASA: III  Anesthesia Plan: General   Post-op Pain Management:    Induction: Intravenous, Rapid sequence and Cricoid pressure planned  Airway Management Planned: Oral ETT  Additional Equipment:   Intra-op Plan:   Post-operative Plan: Extubation in OR  Informed Consent: I have reviewed the patients History and Physical, chart, labs and discussed the procedure including the risks, benefits and alternatives for the proposed anesthesia with the patient or authorized representative who has indicated his/her understanding and acceptance.   Dental advisory given  Plan Discussed with: CRNA  Anesthesia Plan Comments:         Anesthesia Quick Evaluation

## 2015-09-25 NOTE — Transfer of Care (Signed)
Immediate Anesthesia Transfer of Care Note  Patient: Joe Kidd  Procedure(s) Performed: Procedure(s):  LAPAROSCOPIC CHOLECYSTECTOMY (N/A)  Patient Location: PACU  Anesthesia Type:General  Level of Consciousness: awake, oriented and patient cooperative  Airway & Oxygen Therapy: Patient Spontanous Breathing and Patient connected to face mask oxygen  Post-op Assessment: Report given to RN and Post -op Vital signs reviewed and stable  Post vital signs: Reviewed and stable  Last Vitals:  Filed Vitals:   09/25/15 0955 09/25/15 1000  BP: 143/94 146/93  Pulse:    Temp:    Resp: 18 20    Complications: No apparent anesthesia complications

## 2015-09-25 NOTE — Anesthesia Postprocedure Evaluation (Signed)
Anesthesia Post Note  Patient: Joe Kidd  Procedure(s) Performed: Procedure(s) (LRB):  LAPAROSCOPIC CHOLECYSTECTOMY (N/A)  Patient location during evaluation: PACU Anesthesia Type: General Level of consciousness: awake and alert and oriented Pain management: pain level controlled Vital Signs Assessment: post-procedure vital signs reviewed and stable Respiratory status: respiratory function stable Cardiovascular status: stable Postop Assessment: no signs of nausea or vomiting Anesthetic complications: no    Last Vitals:  Filed Vitals:   09/25/15 1300 09/25/15 1304  BP: 179/107   Pulse: 75 74  Temp:    Resp: 19 18    Last Pain:  Filed Vitals:   09/25/15 1308  PainSc: 6                  ADAMS, AMY A

## 2015-09-25 NOTE — Progress Notes (Signed)
Awake. States pain med effective. Refuses po fluids.

## 2015-09-25 NOTE — Progress Notes (Signed)
Progress report called to family. Voiced understanding.

## 2015-09-25 NOTE — Anesthesia Procedure Notes (Signed)
Procedure Name: Intubation Date/Time: 09/25/2015 10:11 AM Performed by: Pernell Dupre, AMY A Pre-anesthesia Checklist: Patient identified, Patient being monitored, Timeout performed, Emergency Drugs available and Suction available Patient Re-evaluated:Patient Re-evaluated prior to inductionOxygen Delivery Method: Circle System Utilized Preoxygenation: Pre-oxygenation with 100% oxygen Intubation Type: IV induction, Rapid sequence and Cricoid Pressure applied Laryngoscope Size: 3 and Miller Grade View: Grade II Tube type: Oral Tube size: 7.0 mm Number of attempts: 1 Airway Equipment and Method: Stylet Placement Confirmation: ETT inserted through vocal cords under direct vision,  positive ETCO2 and breath sounds checked- equal and bilateral Secured at: 21 cm Tube secured with: Tape Dental Injury: Teeth and Oropharynx as per pre-operative assessment

## 2015-09-25 NOTE — Progress Notes (Signed)
BP 164/111. Dr Benancio Deeds notified. Dr Benancio Deeds at bedside to check pt. Orders given.

## 2015-09-25 NOTE — Progress Notes (Signed)
[facility]                                                                              Patient Demographics  Joe Kidd, is a 60 y.o. male, DOB - 01/17/1956, BJS:[id number]  Admit date - 09/21/2015   Admitting Physician Vassie Loll, MD  Outpatient Primary MD for the patient is Colette Ribas, MD  LOS - 4   Chief Complaint  Patient presents with  . Hypertension       Brief HPI   Joe Kidd is a 60 y.o. male with a past medical history significant for reflux and obesity; who presented to the emergency department secondary to abdominal pain, indigestion and elevated blood pressure. Patient reports eating some hot dogs at home the night prior to admission with a subsequent indigestion symptoms and abdominal pain. Patient was localizing his epigastric area and right upper quadrant; no relieving factors, no aggravating factors, mild nausea, no vomiting; pain was not radiated to any other place and didn't have any further associated symptoms. On the morning of admission patient decided to go to the urgent care since he continued to have some discomfort in his abdomen. At the urgent care patient was found with severely elevated high blood pressure (systolic blood pressure in the 190s range and diastolic blood pressure in the 115s range), visible he was transferred to the emergency department for further evaluation and treatment. Once in the ED patient workup demonstrated distended gallbladder on CT scan, transaminitis (AST and ALT in 1300 and 1800 range respectively). Patient was admitted for further workup.   Assessment & Plan    Abdominal pain due to choledocholithiasis/cholelithiasis: Epigastric area and right upper quadrant -CT scan demonstrating choledocholithiasis; LFT's continue trending down -Bilirubin is trending down; mild icterus and jaundice resolved -Right upper quadrant ultrasound as reported below. MRCP abnormal, status post ERCP with sphincterectomy  and stenting on 2/27  -plan for cholecystectomy on 09/25/15 today  -Will continue holding on antibiotics for now as recommended by GI.   Essential hypertension: Newly diagnosed; with Hypertensive urgency On admission. -Most likely associated with ongoing pain, undiagnosed hypertension -Continue Coreg, as needed hydralazine    Transaminitis: Most likely associated with choledocholithiasis -Hepatitis panel negative -LFT's continue decreasing appropriately and steadily  -MRCP suggesting some defect on common bile duct and confirming stone in neck of gallbladder. S/P ERCP with sphincterectomy and stenting 2/27. -Cholecystectomy can today  GERD (gastroesophageal reflux disease) -Continue PPI  Obesity: -Body mass index is 37.48 kg/(m^2).  - Counseled on diet control and exercise  hyperglycemia: Without history of diabetes - A1C 6.0 - Continue sliding scale insulin  Code Status: Full CODE STATUS  Family Communication: Discussed in detail with the patient, all imaging results, lab results explained to the patient    Disposition Plan: Hopefully tomorrow  Time Spent in minutes   25 minutes  Procedures   Right upper quadrant ultrasound: Demonstrated occult bladder dissection with gallstone retained in the neck of the gallbladder. There is also common bile duct dilatation at 10 mm.  MRCP: with some defect appreciated in common bile duct   ERCP with sphincterotomy and stent  Consults  GI Surgery  DVT Prophylaxis SCD's  Medications  Scheduled Meds: . [MAR Hold] carvedilol  12.5 mg Oral BID WC  . [MAR Hold] Influenza vac split quadrivalent PF  0.5 mL Intramuscular Tomorrow-1000  . [MAR Hold] pantoprazole (PROTONIX) IV  40 mg Intravenous Q24H   Continuous Infusions: . sodium chloride 75 mL/hr at 09/25/15 0348  . lactated ringers 75 mL/hr at 09/25/15 0849   PRN Meds:.[MAR Hold] acetaminophen, [MAR Hold] hydrALAZINE, midazolam, [MAR Hold]  morphine injection, [MAR Hold]  ondansetron **OR** [MAR Hold] ondansetron (ZOFRAN) IV, sodium chloride irrigation   Antibiotics   Anti-infectives    Start     Dose/Rate Route Frequency Ordered Stop   09/24/15 1100  ciprofloxacin (CIPRO) IVPB 400 mg     400 mg 200 mL/hr over 60 Minutes Intravenous On call to O.R. 09/24/15 1052 09/25/15 0559   09/23/15 1115  ceFAZolin (ANCEF) IVPB 1 g/50 mL premix     1 g 100 mL/hr over 30 Minutes Intravenous On call 09/23/15 1114 09/23/15 1451        Subjective:   Joe Kidd was seen and examined today.  Patient denies dizziness, chest pain, shortness of breath, abdominal pain, N/V/D/C, new weakness, numbess, tingling. No acute events overnight.  Awaiting cholecystectomy today  Objective:   Filed Vitals:   09/25/15 0945 09/25/15 0950 09/25/15 0955 09/25/15 1000  BP: 153/92 141/91 143/94 146/93  Pulse:      Temp:      TempSrc:      Resp: 19 19 18 20   Height:      Weight:      SpO2: 96% 97% 98% 98%    Intake/Output Summary (Last 24 hours) at 09/25/15 1134 Last data filed at 09/25/15 1115  Gross per 24 hour  Intake   1440 ml  Output      5 ml  Net   1435 ml     Wt Readings from Last 3 Encounters:  09/25/15 128.822 kg (284 lb)     Exam  General: Alert and oriented x 3, NAD  HEENT:  PERRLA, EOMI, Anicteric Sclera, mucous membranes moist.   Neck: Supple, no JVD, no masses  CVS: S1 S2 auscultated, no rubs, murmurs or gallops. Regular rate and rhythm.  Respiratory: Clear to auscultation bilaterally, no wheezing, rales or rhonchi  Abdomen: Soft, nontender, nondistended, + bowel sounds  Ext: no cyanosis clubbing or edema  Neuro: AAOx3, Cr N's II- XII. Strength 5/5 upper and lower extremities bilaterally  Skin: No rashes  Psych: Normal affect and demeanor, alert and oriented x3    Data Review   Micro Results Recent Results (from the past 240 hour(s))  Surgical pcr screen     Status: None   Collection Time: 09/24/15  3:17 PM  Result Value Ref Range  Status   MRSA, PCR NEGATIVE NEGATIVE Final   Staphylococcus aureus NEGATIVE NEGATIVE Final    Comment:        The Xpert SA Assay (FDA approved for NASAL specimens in patients over 9 years of age), is one component of a comprehensive surveillance program.  Test performance has been validated by [facility] for patients greater than or equal to 6 year old. It is not intended to diagnose infection nor to guide or monitor treatment.     Radiology Reports Ct Abdomen Pelvis W Contrast  09/21/2015  CLINICAL DATA:  Intermittent upper abdominal pain since last night. EXAM: CT ABDOMEN AND PELVIS WITH CONTRAST TECHNIQUE: Multidetector CT imaging of the abdomen and pelvis  was performed using the standard protocol following bolus administration of intravenous contrast. CONTRAST:  OMNIPAQUE IOHEXOL 300 MG/ML SOLN, 50mL OMNIPAQUE IOHEXOL 300 MG/ML SOLN COMPARISON:  None. FINDINGS: Mild atelectasis in the right lung base. Lung bases are otherwise within normal limits. No free air or fluid. The gallbladder is distended with mild increased attenuation in the fat adjacent to the neck of the gallbladder and adjacent bile ducts. No filling defects are seen within the bile ducts. There is mild prominence of the intrahepatic bile ducts as well. The liver and portal vein are otherwise within normal limits. The spleen, adrenal glands, pancreas, and kidneys are within normal limits. No aneurysm or adenopathy identified within the abdomen. The stomach and small bowel are normal. The colon demonstrates scattered diverticuli with no diverticulitis. The appendix is well seen with no evidence of appendicitis. The pelvis demonstrates no adenopathy or mass. The bladder, prostate, and seminal vesicles are normal. No acute bony abnormality. IMPRESSION: There is distention of the gallbladder with mild stranding adjacent to the neck of the gallbladder and adjacent bile ducts. Mild intra hepatic biliary duct prominence. These  findings are likely related to the patient's pain. An ultrasound could better evaluate the gallbladder. If there is concern for biliary obstruction after ultrasound and/or labs, an MRCP could better evaluate the biliary tree. Electronically Signed   By: Gerome Sam III M.D   On: 09/21/2015 13:09   Mr 3d Recon At Scanner  09/23/2015  ADDENDUM REPORT: 09/23/2015 11:04 ADDENDUM: Further review of this study does show mild dilatation of the central intrahepatic bile ducts and common hepatic duct which measures approximately 8 mm. The common bile duct does not appear dilated distal to this point, with suggestion of a transition zone in the distal common hepatic duct near the cystic duct insertion site. This shows slight shows mild wall thickening and enhancement (series 5009, image 48), but no definite obstructing calculus is visualized. This is suspicious for a stricture, which may be inflammatory in etiology, although neoplasm cannot definitely be excluded. Consider ERCP for further evaluation. These findings were discussed with Dr. Kendell Bane by telephone at the time of this addendum at 1100 hours on 09/23/2015. Electronically Signed   By: Myles Rosenthal M.D.   On: 09/23/2015 11:04  09/23/2015  CLINICAL DATA:  Upper abdominal pain with cholelithiasis, distended gallbladder, and biliary dilatation seen on recent ultrasound and CT. EXAM: MRI ABDOMEN WITHOUT AND WITH CONTRAST (INCLUDING MRCP) TECHNIQUE: Multiplanar multisequence MR imaging of the abdomen was performed both before and after the administration of intravenous contrast. Heavily T2-weighted images of the biliary and pancreatic ducts were obtained, and three-dimensional MRCP images were rendered by post processing. CONTRAST:  20mL MULTIHANCE GADOBENATE DIMEGLUMINE 529 MG/ML IV SOLN COMPARISON:  Ultrasound on 09/22/2015 and CT on 09/21/2015 FINDINGS: Lower chest:  Mild bibasilar atelectasis noted. Hepatobiliary: A tiny sub-cm cyst is seen in the posterior  right hepatic lobe, however no liver masses are identified. The gallbladder is distended and contains 2 small less than 1 cm stones in the gallbladder neck. There is no evidence of gallbladder wall thickening, although there is minimal pericholecystic edema adjacent to the gallbladder neck, best seen on image 21 of series 4. This is suspicious for acute cholecystitis. Exam is partially degraded by motion artifact. There is no definite evidence of biliary ductal dilatation or choledocholithiasis. Pancreas: No mass, inflammatory changes, or other parenchymal abnormality identified. No evidence of pancreatic ductal dilatation. Spleen:  Within normal limits in size and appearance. Adrenals/Urinary Tract:  No masses identified. No evidence of hydronephrosis. Stomach/Bowel: Tiny hiatal hernia noted. Visualized portions within the abdomen are unremarkable. Vascular/Lymphatic: No pathologically enlarged lymph nodes identified. No abdominal aortic aneurysm demonstrated. Other:  None. Musculoskeletal:  No suspicious bone lesions identified. IMPRESSION: Distended gallbladder with 2 tiny less than 1 cm gallstones in the gallbladder neck and mild pericholecystic edema, suspicious for acute cholecystitis. Recommend clinical correlation, and consider nuclear medicine hepatobiliary imaging if clinically warranted. Exam degradation by motion artifact. No definite evidence of biliary ductal dilatation or choledocholithiasis. Mild bibasilar atelectasis. Tiny hiatal hernia. Electronically Signed: By: Myles Rosenthal M.D. On: 09/23/2015 09:52   Dg Ercp With Sphincterotomy  09/23/2015  CLINICAL DATA:  60 year old male status post ERCP with sphincterotomy EXAM: ERCP TECHNIQUE: Multiple spot images obtained with the fluoroscopic device and submitted for interpretation post-procedure. FLUOROSCOPY TIME:  If the device does not provide the exposure index: Fluoroscopy Time:  3 minutes 14 seconds reported COMPARISON:  MRCP 09/23/2015 FINDINGS: A  total of 6 intraoperative spot images in for sending clips are submitted. The images demonstrate a flexible endoscope in the descending duodenum followed by cannulation of the common bile duct. Opacification of the biliary tree demonstrates mild dilatation of the common bile duct with several filling defects concerning for choledocholithiasis. The subsequent images show sphincterotomy and balloon sweeping of the common duct and finally placement of a plastic biliary stent. IMPRESSION: 1. ERCP demonstrates mild dilatation of the common bile duct with filling defects concerning for possible choledocholithiasis. 2. Subsequently sphincterotomy and balloon sweep of the duct are performed followed by placement of a plastic biliary stent. These images were submitted for radiologic interpretation only. Please see the procedural report for the amount of contrast and the fluoroscopy time utilized. Electronically Signed   By: Malachy Moan M.D.   On: 09/23/2015 16:18   Mr Roe Coombs W/wo Cm/mrcp  09/23/2015  ADDENDUM REPORT: 09/23/2015 11:04 ADDENDUM: Further review of this study does show mild dilatation of the central intrahepatic bile ducts and common hepatic duct which measures approximately 8 mm. The common bile duct does not appear dilated distal to this point, with suggestion of a transition zone in the distal common hepatic duct near the cystic duct insertion site. This shows slight shows mild wall thickening and enhancement (series 5009, image 48), but no definite obstructing calculus is visualized. This is suspicious for a stricture, which may be inflammatory in etiology, although neoplasm cannot definitely be excluded. Consider ERCP for further evaluation. These findings were discussed with Dr. Kendell Bane by telephone at the time of this addendum at 1100 hours on 09/23/2015. Electronically Signed   By: Myles Rosenthal M.D.   On: 09/23/2015 11:04  09/23/2015  CLINICAL DATA:  Upper abdominal pain with cholelithiasis,  distended gallbladder, and biliary dilatation seen on recent ultrasound and CT. EXAM: MRI ABDOMEN WITHOUT AND WITH CONTRAST (INCLUDING MRCP) TECHNIQUE: Multiplanar multisequence MR imaging of the abdomen was performed both before and after the administration of intravenous contrast. Heavily T2-weighted images of the biliary and pancreatic ducts were obtained, and three-dimensional MRCP images were rendered by post processing. CONTRAST:  20mL MULTIHANCE GADOBENATE DIMEGLUMINE 529 MG/ML IV SOLN COMPARISON:  Ultrasound on 09/22/2015 and CT on 09/21/2015 FINDINGS: Lower chest:  Mild bibasilar atelectasis noted. Hepatobiliary: A tiny sub-cm cyst is seen in the posterior right hepatic lobe, however no liver masses are identified. The gallbladder is distended and contains 2 small less than 1 cm stones in the gallbladder neck. There is no evidence of gallbladder wall thickening, although  there is minimal pericholecystic edema adjacent to the gallbladder neck, best seen on image 21 of series 4. This is suspicious for acute cholecystitis. Exam is partially degraded by motion artifact. There is no definite evidence of biliary ductal dilatation or choledocholithiasis. Pancreas: No mass, inflammatory changes, or other parenchymal abnormality identified. No evidence of pancreatic ductal dilatation. Spleen:  Within normal limits in size and appearance. Adrenals/Urinary Tract: No masses identified. No evidence of hydronephrosis. Stomach/Bowel: Tiny hiatal hernia noted. Visualized portions within the abdomen are unremarkable. Vascular/Lymphatic: No pathologically enlarged lymph nodes identified. No abdominal aortic aneurysm demonstrated. Other:  None. Musculoskeletal:  No suspicious bone lesions identified. IMPRESSION: Distended gallbladder with 2 tiny less than 1 cm gallstones in the gallbladder neck and mild pericholecystic edema, suspicious for acute cholecystitis. Recommend clinical correlation, and consider nuclear medicine  hepatobiliary imaging if clinically warranted. Exam degradation by motion artifact. No definite evidence of biliary ductal dilatation or choledocholithiasis. Mild bibasilar atelectasis. Tiny hiatal hernia. Electronically Signed: By: Myles Rosenthal M.D. On: 09/23/2015 09:52   US Abdomen Limited Ruq  09/22/2015  CLINICAL DATA:  Right upper quadrant epigastric pain for 2 days. Distended gallbladder and ducts on CT. EXAM: US ABDOMEN LIMITED - RIGHT UPPER QUADRANT COMPARISON:  CT 09/21/2015 FINDINGS: Gallbladder: The gallbladder is distended. There is a small echogenic focus within the gallbladder neck measuring 12 mm. No wall thickening. Negative sonographic Murphy's. Common bile duct: Diameter: Mildly dilated, measuring 9-10 mm. The distal duct is not visualized due to overlying bowel gas. Liver: Mildly increased echotexture suggesting fatty infiltration. No visible intrahepatic biliary ductal dilatation. No focal abnormality. IMPRESSION: Mild distention of the gallbladder. There appears to be a 12 mm stone in the region of the gallbladder neck which is non mobile. No wall thickening. Negative sonographic Murphy's. Common bile duct is dilated, nearly 10 mm, but the distal duct cannot be visualized. May consider further evaluation with MRCP. Electronically Signed   By: Charlett Nose M.D.   On: 09/22/2015 10:28    CBC  Recent Labs Lab 09/21/15 1137 09/22/15 0616  WBC 7.1 6.2  HGB 17.2* 15.6  HCT 51.5 47.5  PLT 215 208  MCV 91.2 91.3  MCH 30.4 30.0  MCHC 33.4 32.8  RDW 12.5 12.8  LYMPHSABS 1.0  --   MONOABS 0.7  --   EOSABS 0.4  --   BASOSABS 0.1  --     Chemistries   Recent Labs Lab 09/21/15 1137 09/21/15 2051 09/22/15 0616 09/23/15 0525 09/24/15 0532 09/25/15 0534  NA 137  --   --  136  --   --   K 4.3  --   --  3.9  --   --   CL 101  --   --  104  --   --   CO2 28  --   --  24  --   --   GLUCOSE 111*  --   --  91  --   --   BUN 15  --   --  13  --   --   CREATININE 0.89  --   --   0.61  --   --   CALCIUM 9.2  --   --  8.2*  --   --   MG  --  1.9  --   --   --   --   AST 1316*  --  510* 236* 153* 86*  ALT 1867*  --  1433* 972* 727* 509*  ALKPHOS 111  --  145* 152* 163* 153*  BILITOT 3.4*  --  7.8* 8.5* 5.5* 2.9*   ------------------------------------------------------------------------------------------------------------------ estimated creatinine clearance is 141.6 mL/min (by C-G formula based on Cr of 0.61). ------------------------------------------------------------------------------------------------------------------ No results for input(s): HGBA1C in the last 72 hours. ------------------------------------------------------------------------------------------------------------------ No results for input(s): CHOL, HDL, LDLCALC, TRIG, CHOLHDL, LDLDIRECT in the last 72 hours. ------------------------------------------------------------------------------------------------------------------ No results for input(s): TSH, T4TOTAL, T3FREE, THYROIDAB in the last 72 hours.  Invalid input(s): FREET3 ------------------------------------------------------------------------------------------------------------------ No results for input(s): VITAMINB12, FOLATE, FERRITIN, TIBC, IRON, RETICCTPCT in the last 72 hours.  Coagulation profile  Recent Labs Lab 09/22/15 0616  INR 1.11    No results for input(s): DDIMER in the last 72 hours.  Cardiac Enzymes  Recent Labs Lab 09/21/15 1137  TROPONINI <0.03   ------------------------------------------------------------------------------------------------------------------ Invalid input(s): POCBNP  No results for input(s): GLUCAP in the last 72 hours.   Emelynn Rance M.D. [facility] 09/25/2015, 11:34 AM  Pager: [phone] Between 7am to 7pm - call Pager - [phone]  After 7pm go to [email or web address] - password [email or web address]  Call night coverage person covering after 7pm

## 2015-09-26 ENCOUNTER — Encounter: Payer: Self-pay | Admitting: Internal Medicine

## 2015-09-26 ENCOUNTER — Telehealth: Payer: Self-pay

## 2015-09-26 ENCOUNTER — Telehealth: Payer: Self-pay | Admitting: Gastroenterology

## 2015-09-26 DIAGNOSIS — I10 Essential (primary) hypertension: Secondary | ICD-10-CM

## 2015-09-26 DIAGNOSIS — R740 Nonspecific elevation of levels of transaminase and lactic acid dehydrogenase [LDH]: Secondary | ICD-10-CM

## 2015-09-26 DIAGNOSIS — E669 Obesity, unspecified: Secondary | ICD-10-CM

## 2015-09-26 LAB — BASIC METABOLIC PANEL
ANION GAP: 8 (ref 5–15)
BUN: 13 mg/dL (ref 6–20)
CHLORIDE: 101 mmol/L (ref 101–111)
CO2: 28 mmol/L (ref 22–32)
Calcium: 8.5 mg/dL — ABNORMAL LOW (ref 8.9–10.3)
Creatinine, Ser: 0.83 mg/dL (ref 0.61–1.24)
GFR calc non Af Amer: >60 mL/min (ref >60)
GLUCOSE: 103 mg/dL — AB (ref 65–99)
Potassium: 4 mmol/L (ref 3.5–5.1)
Sodium: 137 mmol/L (ref 135–145)

## 2015-09-26 LAB — CBC
HEMATOCRIT: 45.9 % (ref 39.0–52.0)
HEMOGLOBIN: 15 g/dL (ref 13.0–17.0)
MCH: 30.1 pg (ref 26.0–34.0)
MCHC: 32.7 g/dL (ref 30.0–36.0)
MCV: 92.2 fL (ref 78.0–100.0)
Platelets: 209 10*3/uL (ref 150–400)
RBC: 4.98 MIL/uL (ref 4.22–5.81)
RDW: 13 % (ref 11.5–15.5)
WBC: 10.8 10*3/uL — AB (ref 4.0–10.5)

## 2015-09-26 LAB — HEPATIC FUNCTION PANEL
ALBUMIN: 3.2 g/dL — AB (ref 3.5–5.0)
ALK PHOS: 129 U/L — AB (ref 38–126)
ALT: 446 U/L — AB (ref 17–63)
AST: 97 U/L — AB (ref 15–41)
BILIRUBIN DIRECT: 0.9 mg/dL — AB (ref 0.1–0.5)
BILIRUBIN TOTAL: 2.7 mg/dL — AB (ref 0.3–1.2)
Indirect Bilirubin: 1.8 mg/dL — ABNORMAL HIGH (ref 0.3–0.9)
Total Protein: 6.5 g/dL (ref 6.5–8.1)

## 2015-09-26 LAB — MAGNESIUM: Magnesium: 1.8 mg/dL (ref 1.7–2.4)

## 2015-09-26 LAB — PHOSPHORUS: PHOSPHORUS: 2.7 mg/dL (ref 2.5–4.6)

## 2015-09-26 MED ORDER — CARVEDILOL 12.5 MG PO TABS: 12.5000 mg | ORAL_TABLET | Freq: Two times a day (BID) | ORAL | Status: AC

## 2015-09-26 NOTE — Progress Notes (Signed)
Pt's SCD's replaced.

## 2015-09-26 NOTE — Care Management Note (Addendum)
Case Management Note  Patient Details  Name: Atilla Zollner MRN: [id number] Date of Birth: [birth year]  Subjective/Objective:                  Pt is from home, lives with wife and kids. Pt is ind with ADL's. Pt has PCP, insurance and no difficulty obtaining medications. Pt's family at bedside for assessment. Pt plans to return home with self care at DC. DC anticipated for tomorrow.   Action/Plan: No CM needs.   Expected Discharge Date:                  Expected Discharge Plan:  Home/Self Care  In-House Referral:  NA  Discharge planning Services  CM Consult  Post Acute Care Choice:  NA Choice offered to:  NA  DME Arranged:    DME Agency:     HH Arranged:    HH Agency:     Status of Service:  Completed, signed off  Medicare Important Message Given:    Date Medicare IM Given:    Medicare IM give by:    Date Additional Medicare IM Given:    Additional Medicare Important Message give by:     If discussed at Long Length of Stay Meetings, dates discussed:  09/26/2015  Additional Comments:  Malcolm Metro, RN 09/26/2015, 3:59 PM

## 2015-09-26 NOTE — Telephone Encounter (Signed)
Patient needs LFTs next week (dx biliary obstruction/abnormal lfts). He should have follow up with Dr. Lovell Sheehan next Tuesday. Please have Dr. York Ram office fax labs when available.

## 2015-09-26 NOTE — Telephone Encounter (Signed)
Per RMR-  Send letter to patient.  Send copy of letter with path to referring provider and PCP.    Should have an office visit in about 8 weeks to make plans to remove stent

## 2015-09-26 NOTE — Telephone Encounter (Signed)
PATIENT HAS APPOINTMENT  °

## 2015-09-26 NOTE — Addendum Note (Signed)
Addendum  created 09/26/15 0813 by Despina Hidden, CRNA   Modules edited: Clinical Notes   Clinical Notes:  File: [id number]

## 2015-09-26 NOTE — Telephone Encounter (Signed)
Letter mailed to the pt. 

## 2015-09-26 NOTE — Progress Notes (Signed)
Subjective:  Feels better. Very little pain.   Objective: Vital signs in last 24 hours: Temp:  [97.8 F (36.6 C)-99 F (37.2 C)] 98.8 F (37.1 C) (03/02 0543) Pulse Rate:  [70-97] 86 (03/02 0543) Resp:  [14-28] 20 (03/02 0543) BP: (138-183)/(82-111) 157/89 mmHg (03/02 0543) SpO2:  [92 %-100 %] 94 % (03/02 0543) Weight:  [284 lb (128.822 kg)] 284 lb (128.822 kg) (03/01 0830) Last BM Date: 09/25/15 General:   Alert,  Well-developed, well-nourished, pleasant and cooperative in NAD Head:  Normocephalic and atraumatic. Eyes:  Sclera clear, no icterus.  Abdomen:  Soft, nontender and nondistended.  Normal bowel sounds, without guarding, and without rebound.   Extremities:  Without clubbing, deformity or edema. Neurologic:  Alert and  oriented x4;  grossly normal neurologically. Skin:  Intact without significant lesions or rashes. Psych:  Alert and cooperative. Normal mood and affect.  Intake/Output from previous day: 03/01 0701 - 03/02 0700 In: 9648.8 [I.V.:9648.8] Out: 540 [Urine:450; Drains:75; Blood:15] Intake/Output this shift:    Lab Results: CBC  Recent Labs  09/26/15 0554  WBC 10.8*  HGB 15.0  HCT 45.9  MCV 92.2  PLT 209   BMET  Recent Labs  09/26/15 0554  NA 137  K 4.0  CL 101  CO2 28  GLUCOSE 103*  BUN 13  CREATININE 0.83  CALCIUM 8.5*   LFTs  Recent Labs  09/24/15 0532 09/25/15 0534 09/26/15 0554  BILITOT 5.5* 2.9* 2.7*  BILIDIR 3.1* 1.3* 0.9*  IBILI 2.4* 1.6* 1.8*  ALKPHOS 163* 153* 129*  AST 153* 86* 97*  ALT 727* 509* 446*  PROT 6.3* 6.4* 6.5  ALBUMIN 3.3* 3.3* 3.2*   No results for input(s): LIPASE in the last 72 hours. PT/INR No results for input(s): LABPROT, INR in the last 72 hours.    Imaging Studies: Ct Abdomen Pelvis W Contrast  09/21/2015  CLINICAL DATA:  Intermittent upper abdominal pain since last night. EXAM: CT ABDOMEN AND PELVIS WITH CONTRAST TECHNIQUE: Multidetector CT imaging of the abdomen and pelvis was performed  using the standard protocol following bolus administration of intravenous contrast. CONTRAST:  OMNIPAQUE IOHEXOL 300 MG/ML SOLN, 50mL OMNIPAQUE IOHEXOL 300 MG/ML SOLN COMPARISON:  None. FINDINGS: Mild atelectasis in the right lung base. Lung bases are otherwise within normal limits. No free air or fluid. The gallbladder is distended with mild increased attenuation in the fat adjacent to the neck of the gallbladder and adjacent bile ducts. No filling defects are seen within the bile ducts. There is mild prominence of the intrahepatic bile ducts as well. The liver and portal vein are otherwise within normal limits. The spleen, adrenal glands, pancreas, and kidneys are within normal limits. No aneurysm or adenopathy identified within the abdomen. The stomach and small bowel are normal. The colon demonstrates scattered diverticuli with no diverticulitis. The appendix is well seen with no evidence of appendicitis. The pelvis demonstrates no adenopathy or mass. The bladder, prostate, and seminal vesicles are normal. No acute bony abnormality. IMPRESSION: There is distention of the gallbladder with mild stranding adjacent to the neck of the gallbladder and adjacent bile ducts. Mild intra hepatic biliary duct prominence. These findings are likely related to the patient's pain. An ultrasound could better evaluate the gallbladder. If there is concern for biliary obstruction after ultrasound and/or labs, an MRCP could better evaluate the biliary tree. Electronically Signed   By: Gerome Sam III M.D   On: 09/21/2015 13:09   Mr 3d Recon At Scanner  09/23/2015  ADDENDUM REPORT: 09/23/2015 11:04 ADDENDUM: Further review of this study does show mild dilatation of the central intrahepatic bile ducts and common hepatic duct which measures approximately 8 mm. The common bile duct does not appear dilated distal to this point, with suggestion of a transition zone in the distal common hepatic duct near the cystic duct  insertion site. This shows slight shows mild wall thickening and enhancement (series 5009, image 48), but no definite obstructing calculus is visualized. This is suspicious for a stricture, which may be inflammatory in etiology, although neoplasm cannot definitely be excluded. Consider ERCP for further evaluation. These findings were discussed with Dr. Kendell Bane by telephone at the time of this addendum at 1100 hours on 09/23/2015. Electronically Signed   By: Myles Rosenthal M.D.   On: 09/23/2015 11:04  09/23/2015  CLINICAL DATA:  Upper abdominal pain with cholelithiasis, distended gallbladder, and biliary dilatation seen on recent ultrasound and CT. EXAM: MRI ABDOMEN WITHOUT AND WITH CONTRAST (INCLUDING MRCP) TECHNIQUE: Multiplanar multisequence MR imaging of the abdomen was performed both before and after the administration of intravenous contrast. Heavily T2-weighted images of the biliary and pancreatic ducts were obtained, and three-dimensional MRCP images were rendered by post processing. CONTRAST:  20mL MULTIHANCE GADOBENATE DIMEGLUMINE 529 MG/ML IV SOLN COMPARISON:  Ultrasound on 09/22/2015 and CT on 09/21/2015 FINDINGS: Lower chest:  Mild bibasilar atelectasis noted. Hepatobiliary: A tiny sub-cm cyst is seen in the posterior right hepatic lobe, however no liver masses are identified. The gallbladder is distended and contains 2 small less than 1 cm stones in the gallbladder neck. There is no evidence of gallbladder wall thickening, although there is minimal pericholecystic edema adjacent to the gallbladder neck, best seen on image 21 of series 4. This is suspicious for acute cholecystitis. Exam is partially degraded by motion artifact. There is no definite evidence of biliary ductal dilatation or choledocholithiasis. Pancreas: No mass, inflammatory changes, or other parenchymal abnormality identified. No evidence of pancreatic ductal dilatation. Spleen:  Within normal limits in size and appearance. Adrenals/Urinary  Tract: No masses identified. No evidence of hydronephrosis. Stomach/Bowel: Tiny hiatal hernia noted. Visualized portions within the abdomen are unremarkable. Vascular/Lymphatic: No pathologically enlarged lymph nodes identified. No abdominal aortic aneurysm demonstrated. Other:  None. Musculoskeletal:  No suspicious bone lesions identified. IMPRESSION: Distended gallbladder with 2 tiny less than 1 cm gallstones in the gallbladder neck and mild pericholecystic edema, suspicious for acute cholecystitis. Recommend clinical correlation, and consider nuclear medicine hepatobiliary imaging if clinically warranted. Exam degradation by motion artifact. No definite evidence of biliary ductal dilatation or choledocholithiasis. Mild bibasilar atelectasis. Tiny hiatal hernia. Electronically Signed: By: Myles Rosenthal M.D. On: 09/23/2015 09:52   Dg Ercp With Sphincterotomy  09/23/2015  CLINICAL DATA:  60 year old male status post ERCP with sphincterotomy EXAM: ERCP TECHNIQUE: Multiple spot images obtained with the fluoroscopic device and submitted for interpretation post-procedure. FLUOROSCOPY TIME:  If the device does not provide the exposure index: Fluoroscopy Time:  3 minutes 14 seconds reported COMPARISON:  MRCP 09/23/2015 FINDINGS: A total of 6 intraoperative spot images in for sending clips are submitted. The images demonstrate a flexible endoscope in the descending duodenum followed by cannulation of the common bile duct. Opacification of the biliary tree demonstrates mild dilatation of the common bile duct with several filling defects concerning for choledocholithiasis. The subsequent images show sphincterotomy and balloon sweeping of the common duct and finally placement of a plastic biliary stent. IMPRESSION: 1. ERCP demonstrates mild dilatation of the common bile duct with filling defects  concerning for possible choledocholithiasis. 2. Subsequently sphincterotomy and balloon sweep of the duct are performed followed by  placement of a plastic biliary stent. These images were submitted for radiologic interpretation only. Please see the procedural report for the amount of contrast and the fluoroscopy time utilized. Electronically Signed   By: Malachy Moan M.D.   On: 09/23/2015 16:18   Mr Roe Coombs W/wo Cm/mrcp  09/23/2015  ADDENDUM REPORT: 09/23/2015 11:04 ADDENDUM: Further review of this study does show mild dilatation of the central intrahepatic bile ducts and common hepatic duct which measures approximately 8 mm. The common bile duct does not appear dilated distal to this point, with suggestion of a transition zone in the distal common hepatic duct near the cystic duct insertion site. This shows slight shows mild wall thickening and enhancement (series 5009, image 48), but no definite obstructing calculus is visualized. This is suspicious for a stricture, which may be inflammatory in etiology, although neoplasm cannot definitely be excluded. Consider ERCP for further evaluation. These findings were discussed with Dr. Kendell Bane by telephone at the time of this addendum at 1100 hours on 09/23/2015. Electronically Signed   By: Myles Rosenthal M.D.   On: 09/23/2015 11:04  09/23/2015  CLINICAL DATA:  Upper abdominal pain with cholelithiasis, distended gallbladder, and biliary dilatation seen on recent ultrasound and CT. EXAM: MRI ABDOMEN WITHOUT AND WITH CONTRAST (INCLUDING MRCP) TECHNIQUE: Multiplanar multisequence MR imaging of the abdomen was performed both before and after the administration of intravenous contrast. Heavily T2-weighted images of the biliary and pancreatic ducts were obtained, and three-dimensional MRCP images were rendered by post processing. CONTRAST:  20mL MULTIHANCE GADOBENATE DIMEGLUMINE 529 MG/ML IV SOLN COMPARISON:  Ultrasound on 09/22/2015 and CT on 09/21/2015 FINDINGS: Lower chest:  Mild bibasilar atelectasis noted. Hepatobiliary: A tiny sub-cm cyst is seen in the posterior right hepatic lobe, however no liver  masses are identified. The gallbladder is distended and contains 2 small less than 1 cm stones in the gallbladder neck. There is no evidence of gallbladder wall thickening, although there is minimal pericholecystic edema adjacent to the gallbladder neck, best seen on image 21 of series 4. This is suspicious for acute cholecystitis. Exam is partially degraded by motion artifact. There is no definite evidence of biliary ductal dilatation or choledocholithiasis. Pancreas: No mass, inflammatory changes, or other parenchymal abnormality identified. No evidence of pancreatic ductal dilatation. Spleen:  Within normal limits in size and appearance. Adrenals/Urinary Tract: No masses identified. No evidence of hydronephrosis. Stomach/Bowel: Tiny hiatal hernia noted. Visualized portions within the abdomen are unremarkable. Vascular/Lymphatic: No pathologically enlarged lymph nodes identified. No abdominal aortic aneurysm demonstrated. Other:  None. Musculoskeletal:  No suspicious bone lesions identified. IMPRESSION: Distended gallbladder with 2 tiny less than 1 cm gallstones in the gallbladder neck and mild pericholecystic edema, suspicious for acute cholecystitis. Recommend clinical correlation, and consider nuclear medicine hepatobiliary imaging if clinically warranted. Exam degradation by motion artifact. No definite evidence of biliary ductal dilatation or choledocholithiasis. Mild bibasilar atelectasis. Tiny hiatal hernia. Electronically Signed: By: Myles Rosenthal M.D. On: 09/23/2015 09:52   US Abdomen Limited Ruq  09/22/2015  CLINICAL DATA:  Right upper quadrant epigastric pain for 2 days. Distended gallbladder and ducts on CT. EXAM: US ABDOMEN LIMITED - RIGHT UPPER QUADRANT COMPARISON:  CT 09/21/2015 FINDINGS: Gallbladder: The gallbladder is distended. There is a small echogenic focus within the gallbladder neck measuring 12 mm. No wall thickening. Negative sonographic Murphy's. Common bile duct: Diameter: Mildly  dilated, measuring 9-10 mm. The distal duct  is not visualized due to overlying bowel gas. Liver: Mildly increased echotexture suggesting fatty infiltration. No visible intrahepatic biliary ductal dilatation. No focal abnormality. IMPRESSION: Mild distention of the gallbladder. There appears to be a 12 mm stone in the region of the gallbladder neck which is non mobile. No wall thickening. Negative sonographic Murphy's. Common bile duct is dilated, nearly 10 mm, but the distal duct cannot be visualized. May consider further evaluation with MRCP. Electronically Signed   By: Charlett Nose M.D.   On: 09/22/2015 10:28  [2 weeks]   Assessment: 60 year old male with elevated LFTs, partial biliary obstruction in setting of acute cholecystitis. ERCP with sphincterotomy on 2/27 notes compression of bile duct likely extrinsic due to markedly inflamed distended gallbladder, attempted removal of common hepatic duct stone but unable to pull through narrowed common bile duct segment. Plastic stent placed across bile duct. Cytology brushings obtained and were benign. s/p cholecystectomy yesterday. LFTs improved from yesterday, ALT slowly improving.   He will require f/u OV to set up repeat ERCP for stent and stone removal, currently scheduled for 11/05/15 at 11am. Plan for repeat ERCP 8 weeks from the last one.  Plan: 1. Hospital follow up appointment 11/05/15 at 11am, to schedule repeat ERCP. Patrice on floor and patient aware of new appointment date/time. 2. Continue to follow LFTs to baseline. Recommend repeat next week. Monitor for increased pain, nausea, fever, etc. 3. Signing off. Please call with questions.   Leanna Battles. Dixon Boos [facility] [phone] 3/2/20179:59 AM     LOS: 5 days

## 2015-09-26 NOTE — Progress Notes (Signed)
1 Day Post-Op  Subjective: Patient feels fine. Minimal incisional pain.  Objective: Vital signs in last 24 hours: Temp:  [98.2 F (36.8 C)-99 F (37.2 C)] 98.8 F (37.1 C) (03/02 0543) Pulse Rate:  [74-97] 86 (03/02 0543) Resp:  [14-23] 20 (03/02 0543) BP: (138-183)/(82-111) 157/89 mmHg (03/02 0543) SpO2:  [92 %-100 %] 94 % (03/02 0543) Last BM Date: 09/25/15  Intake/Output from previous day: 03/01 0701 - 03/02 0700 In: 9648.8 [I.V.:9648.8] Out: 540 [Urine:450; Drains:75; Blood:15] Intake/Output this shift:    General appearance: alert, cooperative and no distress GI: Soft, dressings dry and intact. JP drainage serosanguineous in nature. No bile noted.  Lab Results:   Recent Labs  09/26/15 0554  WBC 10.8*  HGB 15.0  HCT 45.9  PLT 209   BMET  Recent Labs  09/26/15 0554  NA 137  K 4.0  CL 101  CO2 28  GLUCOSE 103*  BUN 13  CREATININE 0.83  CALCIUM 8.5*   PT/INR No results for input(s): LABPROT, INR in the last 72 hours.  Studies/Results: No results found.  Anti-infectives: Anti-infectives    Start     Dose/Rate Route Frequency Ordered Stop   09/25/15 2200  ciprofloxacin (CIPRO) IVPB 400 mg     400 mg 200 mL/hr over 60 Minutes Intravenous Every 12 hours 09/25/15 1538     09/24/15 1100  ciprofloxacin (CIPRO) IVPB 400 mg  Status:  Discontinued     400 mg 200 mL/hr over 60 Minutes Intravenous On call to O.R. 09/24/15 1052 09/25/15 1538   09/23/15 1115  ceFAZolin (ANCEF) IVPB 1 g/50 mL premix     1 g 100 mL/hr over 30 Minutes Intravenous On call 09/23/15 1114 09/23/15 1451      Assessment/Plan: s/p Procedure(s):  LAPAROSCOPIC CHOLECYSTECTOMY Impression: Stable on postoperative day 1. Liver tests continue to improve. We'll advance to regular diet. Will transfer to my service. Anticipate discharge in next 24-48 hours.  LOS: 5 days    Joe Kidd A 09/26/2015

## 2015-09-26 NOTE — Progress Notes (Signed)
Yellow/green drainage from around the JP drain insertion site noted.  New drain sponges and gauze applied.  Will continue to monitor.

## 2015-09-26 NOTE — Progress Notes (Signed)
[facility]                                                                              Patient Demographics  Joe Kidd, is a 60 y.o. male, DOB - [birth year], WUJ:[id number]  Admit date - 09/21/2015   Admitting Physician Vassie Loll, MD  Outpatient Primary MD for the patient is Colette Ribas, MD  LOS - 5   Chief Complaint  Patient presents with  . Hypertension       Brief HPI   Joe Kidd is a 60 y.o. male with a past medical history significant for reflux and obesity; who presented to the emergency department secondary to abdominal pain, indigestion and elevated blood pressure. Patient reports eating some hot dogs at home the night prior to admission with a subsequent indigestion symptoms and abdominal pain. Patient was localizing his epigastric area and right upper quadrant; no relieving factors, no aggravating factors, mild nausea, no vomiting; pain was not radiated to any other place and didn't have any further associated symptoms. On the morning of admission patient decided to go to the urgent care since he continued to have some discomfort in his abdomen. At the urgent care patient was found with severely elevated high blood pressure (systolic blood pressure in the 190s range and diastolic blood pressure in the 115s range), visible he was transferred to the emergency department for further evaluation and treatment. Once in the ED patient workup demonstrated distended gallbladder on CT scan, transaminitis (AST and ALT in 1300 and 1800 range respectively). Patient was admitted for further workup.   Assessment & Plan    Abdominal pain due to choledocholithiasis/cholelithiasis: Epigastric area and right upper quadrant -CT scan demonstrating choledocholithiasis; LFT's continue trending down -Right upper quadrant ultrasound as reported below. MRCP abnormal, status post ERCP with sphincterectomy and stenting on 2/27  -s/p cholecystectomy on 09/25/15 with JP  drain placement -GI arranged hospital follow up on 4/11and plan to repeat ERCP and possible stent removal, patient is now under surgery Dr York Ram service.   Transaminitis: Most likely associated with choledocholithiasis -Hepatitis panel negative -see above  Essential hypertension:  Newly diagnosed; with Hypertensive urgency On admission. -Most likely associated with ongoing pain, undiagnosed hypertension -bp well controlled on  Coreg, has not need any as needed hydralazine in the last few days -discharge home on coreg and patient to establish care with pmd to continue monitor blood pressure and titrate bp meds   GERD (gastroesophageal reflux disease) -Continue PPI  Obesity: -Body mass index is 37.48 kg/(m^2).  - Counseled on diet control and exercise  hyperglycemia: Without history of diabetes - A1C 6.0 - Continue sliding scale insulin -pmd outpatient follow up.  Code Status: Full CODE STATUS  Family Communication: Discussed in detail with the patient, all imaging results, lab results explained to the patient    Disposition Plan: now patient is under general surgery service, discharge plan per general surgery. [facility] will sign off.   Time Spent in minutes   25 minutes  Procedures   Right upper quadrant ultrasound: Demonstrated occult bladder dissection with gallstone retained in the neck of the gallbladder. There is also  common bile duct dilatation at 10 mm.  MRCP: with some defect appreciated in common bile duct   ERCP with sphincterotomy and stent Laparoscopic cholecystectomy with jp drain placement on 3/1  Consults   GI Surgery [facility] primary initially, surgery primary after surgery on 3/1. [facility] signed off on 3/2.  DVT Prophylaxis SCD's  Medications  Scheduled Meds: . carvedilol  12.5 mg Oral BID WC  . ciprofloxacin  400 mg Intravenous Q12H  . enoxaparin (LOVENOX) injection  40 mg Subcutaneous Q24H  . Influenza vac split quadrivalent PF  0.5 mL Intramuscular  Tomorrow-1000   Continuous Infusions:   PRN Meds:.diphenhydrAMINE **OR** diphenhydrAMINE, hydrALAZINE, HYDROmorphone (DILAUDID) injection, LORazepam, magnesium hydroxide, ondansetron **OR** ondansetron (ZOFRAN) IV, oxyCODONE-acetaminophen, simethicone   Antibiotics   Anti-infectives    Start     Dose/Rate Route Frequency Ordered Stop   09/25/15 2200  ciprofloxacin (CIPRO) IVPB 400 mg     400 mg 200 mL/hr over 60 Minutes Intravenous Every 12 hours 09/25/15 1538     09/24/15 1100  ciprofloxacin (CIPRO) IVPB 400 mg  Status:  Discontinued     400 mg 200 mL/hr over 60 Minutes Intravenous On call to O.R. 09/24/15 1052 09/25/15 1538   09/23/15 1115  ceFAZolin (ANCEF) IVPB 1 g/50 mL premix     1 g 100 mL/hr over 30 Minutes Intravenous On call 09/23/15 1114 09/23/15 1451        Subjective:   Joe Kidd was seen and examined today.  Post op day one , doing well, no pain, jp drain decreased amount , less bloody per patient, tolerating diet, multiple family member in room.   Objective:   Filed Vitals:   09/25/15 1837 09/25/15 2010 09/26/15 0543 09/26/15 1001  BP: 158/100 165/97 157/89 126/86  Pulse:  97 86 97  Temp:  98.5 F (36.9 C) 98.8 F (37.1 C) 98.6 F (37 C)  TempSrc:  Oral Oral Oral  Resp:   20 20  Height:      Weight:      SpO2:  98% 94% 96%    Intake/Output Summary (Last 24 hours) at 09/26/15 1447 Last data filed at 09/26/15 0533  Gross per 24 hour  Intake 7948.75 ml  Output    525 ml  Net 7423.75 ml     Wt Readings from Last 3 Encounters:  09/25/15 128.822 kg (284 lb)     Exam  General: Alert and oriented x 3, NAD  HEENT:  PERRLA, EOMI, Anicteric Sclera, mucous membranes moist.   Neck: Supple, no JVD, no masses  CVS: S1 S2 auscultated, no rubs, murmurs or gallops. Regular rate and rhythm.  Respiratory: Clear to auscultation bilaterally, no wheezing, rales or rhonchi  Abdomen: Soft, nontender, nondistended, + bowel sounds, s/p lap chole with jp  drain to RUQ, serosanguineous fluids in bulb.  Ext: no cyanosis clubbing or edema  Neuro: AAOx3, Cr N's II- XII. Strength 5/5 upper and lower extremities bilaterally  Skin: No rashes  Psych: Normal affect and demeanor, alert and oriented x3    Data Review   Micro Results Recent Results (from the past 240 hour(s))  Surgical pcr screen     Status: None   Collection Time: 09/24/15  3:17 PM  Result Value Ref Range Status   MRSA, PCR NEGATIVE NEGATIVE Final   Staphylococcus aureus NEGATIVE NEGATIVE Final    Comment:        The Xpert SA Assay (FDA approved for NASAL specimens in patients over 69 years of age), is  one component of a comprehensive surveillance program.  Test performance has been validated by [facility] for patients greater than or equal to 29 year old. It is not intended to diagnose infection nor to guide or monitor treatment.     Radiology Reports Ct Abdomen Pelvis W Contrast  09/21/2015  CLINICAL DATA:  Intermittent upper abdominal pain since last night. EXAM: CT ABDOMEN AND PELVIS WITH CONTRAST TECHNIQUE: Multidetector CT imaging of the abdomen and pelvis was performed using the standard protocol following bolus administration of intravenous contrast. CONTRAST:  OMNIPAQUE IOHEXOL 300 MG/ML SOLN, 50mL OMNIPAQUE IOHEXOL 300 MG/ML SOLN COMPARISON:  None. FINDINGS: Mild atelectasis in the right lung base. Lung bases are otherwise within normal limits. No free air or fluid. The gallbladder is distended with mild increased attenuation in the fat adjacent to the neck of the gallbladder and adjacent bile ducts. No filling defects are seen within the bile ducts. There is mild prominence of the intrahepatic bile ducts as well. The liver and portal vein are otherwise within normal limits. The spleen, adrenal glands, pancreas, and kidneys are within normal limits. No aneurysm or adenopathy identified within the abdomen. The stomach and small bowel are normal. The colon  demonstrates scattered diverticuli with no diverticulitis. The appendix is well seen with no evidence of appendicitis. The pelvis demonstrates no adenopathy or mass. The bladder, prostate, and seminal vesicles are normal. No acute bony abnormality. IMPRESSION: There is distention of the gallbladder with mild stranding adjacent to the neck of the gallbladder and adjacent bile ducts. Mild intra hepatic biliary duct prominence. These findings are likely related to the patient's pain. An ultrasound could better evaluate the gallbladder. If there is concern for biliary obstruction after ultrasound and/or labs, an MRCP could better evaluate the biliary tree. Electronically Signed   By: Gerome Sam III M.D   On: 09/21/2015 13:09   Mr 3d Recon At Scanner  09/23/2015  ADDENDUM REPORT: 09/23/2015 11:04 ADDENDUM: Further review of this study does show mild dilatation of the central intrahepatic bile ducts and common hepatic duct which measures approximately 8 mm. The common bile duct does not appear dilated distal to this point, with suggestion of a transition zone in the distal common hepatic duct near the cystic duct insertion site. This shows slight shows mild wall thickening and enhancement (series 5009, image 48), but no definite obstructing calculus is visualized. This is suspicious for a stricture, which may be inflammatory in etiology, although neoplasm cannot definitely be excluded. Consider ERCP for further evaluation. These findings were discussed with Dr. Kendell Bane by telephone at the time of this addendum at 1100 hours on 09/23/2015. Electronically Signed   By: Myles Rosenthal M.D.   On: 09/23/2015 11:04  09/23/2015  CLINICAL DATA:  Upper abdominal pain with cholelithiasis, distended gallbladder, and biliary dilatation seen on recent ultrasound and CT. EXAM: MRI ABDOMEN WITHOUT AND WITH CONTRAST (INCLUDING MRCP) TECHNIQUE: Multiplanar multisequence MR imaging of the abdomen was performed both before and after the  administration of intravenous contrast. Heavily T2-weighted images of the biliary and pancreatic ducts were obtained, and three-dimensional MRCP images were rendered by post processing. CONTRAST:  20mL MULTIHANCE GADOBENATE DIMEGLUMINE 529 MG/ML IV SOLN COMPARISON:  Ultrasound on 09/22/2015 and CT on 09/21/2015 FINDINGS: Lower chest:  Mild bibasilar atelectasis noted. Hepatobiliary: A tiny sub-cm cyst is seen in the posterior right hepatic lobe, however no liver masses are identified. The gallbladder is distended and contains 2 small less than 1 cm stones in the gallbladder  neck. There is no evidence of gallbladder wall thickening, although there is minimal pericholecystic edema adjacent to the gallbladder neck, best seen on image 21 of series 4. This is suspicious for acute cholecystitis. Exam is partially degraded by motion artifact. There is no definite evidence of biliary ductal dilatation or choledocholithiasis. Pancreas: No mass, inflammatory changes, or other parenchymal abnormality identified. No evidence of pancreatic ductal dilatation. Spleen:  Within normal limits in size and appearance. Adrenals/Urinary Tract: No masses identified. No evidence of hydronephrosis. Stomach/Bowel: Tiny hiatal hernia noted. Visualized portions within the abdomen are unremarkable. Vascular/Lymphatic: No pathologically enlarged lymph nodes identified. No abdominal aortic aneurysm demonstrated. Other:  None. Musculoskeletal:  No suspicious bone lesions identified. IMPRESSION: Distended gallbladder with 2 tiny less than 1 cm gallstones in the gallbladder neck and mild pericholecystic edema, suspicious for acute cholecystitis. Recommend clinical correlation, and consider nuclear medicine hepatobiliary imaging if clinically warranted. Exam degradation by motion artifact. No definite evidence of biliary ductal dilatation or choledocholithiasis. Mild bibasilar atelectasis. Tiny hiatal hernia. Electronically Signed: By: Myles Rosenthal  M.D. On: 09/23/2015 09:52   Dg Ercp With Sphincterotomy  09/23/2015  CLINICAL DATA:  60 year old male status post ERCP with sphincterotomy EXAM: ERCP TECHNIQUE: Multiple spot images obtained with the fluoroscopic device and submitted for interpretation post-procedure. FLUOROSCOPY TIME:  If the device does not provide the exposure index: Fluoroscopy Time:  3 minutes 14 seconds reported COMPARISON:  MRCP 09/23/2015 FINDINGS: A total of 6 intraoperative spot images in for sending clips are submitted. The images demonstrate a flexible endoscope in the descending duodenum followed by cannulation of the common bile duct. Opacification of the biliary tree demonstrates mild dilatation of the common bile duct with several filling defects concerning for choledocholithiasis. The subsequent images show sphincterotomy and balloon sweeping of the common duct and finally placement of a plastic biliary stent. IMPRESSION: 1. ERCP demonstrates mild dilatation of the common bile duct with filling defects concerning for possible choledocholithiasis. 2. Subsequently sphincterotomy and balloon sweep of the duct are performed followed by placement of a plastic biliary stent. These images were submitted for radiologic interpretation only. Please see the procedural report for the amount of contrast and the fluoroscopy time utilized. Electronically Signed   By: Malachy Moan M.D.   On: 09/23/2015 16:18   Mr Roe Coombs W/wo Cm/mrcp  09/23/2015  ADDENDUM REPORT: 09/23/2015 11:04 ADDENDUM: Further review of this study does show mild dilatation of the central intrahepatic bile ducts and common hepatic duct which measures approximately 8 mm. The common bile duct does not appear dilated distal to this point, with suggestion of a transition zone in the distal common hepatic duct near the cystic duct insertion site. This shows slight shows mild wall thickening and enhancement (series 5009, image 48), but no definite obstructing calculus is  visualized. This is suspicious for a stricture, which may be inflammatory in etiology, although neoplasm cannot definitely be excluded. Consider ERCP for further evaluation. These findings were discussed with Dr. Kendell Bane by telephone at the time of this addendum at 1100 hours on 09/23/2015. Electronically Signed   By: Myles Rosenthal M.D.   On: 09/23/2015 11:04  09/23/2015  CLINICAL DATA:  Upper abdominal pain with cholelithiasis, distended gallbladder, and biliary dilatation seen on recent ultrasound and CT. EXAM: MRI ABDOMEN WITHOUT AND WITH CONTRAST (INCLUDING MRCP) TECHNIQUE: Multiplanar multisequence MR imaging of the abdomen was performed both before and after the administration of intravenous contrast. Heavily T2-weighted images of the biliary and pancreatic ducts were obtained, and three-dimensional  MRCP images were rendered by post processing. CONTRAST:  20mL MULTIHANCE GADOBENATE DIMEGLUMINE 529 MG/ML IV SOLN COMPARISON:  Ultrasound on 09/22/2015 and CT on 09/21/2015 FINDINGS: Lower chest:  Mild bibasilar atelectasis noted. Hepatobiliary: A tiny sub-cm cyst is seen in the posterior right hepatic lobe, however no liver masses are identified. The gallbladder is distended and contains 2 small less than 1 cm stones in the gallbladder neck. There is no evidence of gallbladder wall thickening, although there is minimal pericholecystic edema adjacent to the gallbladder neck, best seen on image 21 of series 4. This is suspicious for acute cholecystitis. Exam is partially degraded by motion artifact. There is no definite evidence of biliary ductal dilatation or choledocholithiasis. Pancreas: No mass, inflammatory changes, or other parenchymal abnormality identified. No evidence of pancreatic ductal dilatation. Spleen:  Within normal limits in size and appearance. Adrenals/Urinary Tract: No masses identified. No evidence of hydronephrosis. Stomach/Bowel: Tiny hiatal hernia noted. Visualized portions within the abdomen  are unremarkable. Vascular/Lymphatic: No pathologically enlarged lymph nodes identified. No abdominal aortic aneurysm demonstrated. Other:  None. Musculoskeletal:  No suspicious bone lesions identified. IMPRESSION: Distended gallbladder with 2 tiny less than 1 cm gallstones in the gallbladder neck and mild pericholecystic edema, suspicious for acute cholecystitis. Recommend clinical correlation, and consider nuclear medicine hepatobiliary imaging if clinically warranted. Exam degradation by motion artifact. No definite evidence of biliary ductal dilatation or choledocholithiasis. Mild bibasilar atelectasis. Tiny hiatal hernia. Electronically Signed: By: Myles Rosenthal M.D. On: 09/23/2015 09:52   US Abdomen Limited Ruq  09/22/2015  CLINICAL DATA:  Right upper quadrant epigastric pain for 2 days. Distended gallbladder and ducts on CT. EXAM: US ABDOMEN LIMITED - RIGHT UPPER QUADRANT COMPARISON:  CT 09/21/2015 FINDINGS: Gallbladder: The gallbladder is distended. There is a small echogenic focus within the gallbladder neck measuring 12 mm. No wall thickening. Negative sonographic Murphy's. Common bile duct: Diameter: Mildly dilated, measuring 9-10 mm. The distal duct is not visualized due to overlying bowel gas. Liver: Mildly increased echotexture suggesting fatty infiltration. No visible intrahepatic biliary ductal dilatation. No focal abnormality. IMPRESSION: Mild distention of the gallbladder. There appears to be a 12 mm stone in the region of the gallbladder neck which is non mobile. No wall thickening. Negative sonographic Murphy's. Common bile duct is dilated, nearly 10 mm, but the distal duct cannot be visualized. May consider further evaluation with MRCP. Electronically Signed   By: Charlett Nose M.D.   On: 09/22/2015 10:28    CBC  Recent Labs Lab 09/21/15 1137 09/22/15 0616 09/26/15 0554  WBC 7.1 6.2 10.8*  HGB 17.2* 15.6 15.0  HCT 51.5 47.5 45.9  PLT 215 208 209  MCV 91.2 91.3 92.2  MCH 30.4 30.0  30.1  MCHC 33.4 32.8 32.7  RDW 12.5 12.8 13.0  LYMPHSABS 1.0  --   --   MONOABS 0.7  --   --   EOSABS 0.4  --   --   BASOSABS 0.1  --   --     Chemistries   Recent Labs Lab 09/21/15 1137 09/21/15 2051 09/22/15 0616 09/23/15 0525 09/24/15 0532 09/25/15 0534 09/26/15 0554  NA 137  --   --  136  --   --  137  K 4.3  --   --  3.9  --   --  4.0  CL 101  --   --  104  --   --  101  CO2 28  --   --  24  --   --  28  GLUCOSE 111*  --   --  91  --   --  103*  BUN 15  --   --  13  --   --  13  CREATININE 0.89  --   --  0.61  --   --  0.83  CALCIUM 9.2  --   --  8.2*  --   --  8.5*  MG  --  1.9  --   --   --   --  1.8  AST 1316*  --  510* 236* 153* 86* 97*  ALT 1867*  --  1433* 972* 727* 509* 446*  ALKPHOS 111  --  145* 152* 163* 153* 129*  BILITOT 3.4*  --  7.8* 8.5* 5.5* 2.9* 2.7*   ------------------------------------------------------------------------------------------------------------------ estimated creatinine clearance is 136.5 mL/min (by C-G formula based on Cr of 0.83). ------------------------------------------------------------------------------------------------------------------ No results for input(s): HGBA1C in the last 72 hours. ------------------------------------------------------------------------------------------------------------------ No results for input(s): CHOL, HDL, LDLCALC, TRIG, CHOLHDL, LDLDIRECT in the last 72 hours. ------------------------------------------------------------------------------------------------------------------ No results for input(s): TSH, T4TOTAL, T3FREE, THYROIDAB in the last 72 hours.  Invalid input(s): FREET3 ------------------------------------------------------------------------------------------------------------------ No results for input(s): VITAMINB12, FOLATE, FERRITIN, TIBC, IRON, RETICCTPCT in the last 72 hours.  Coagulation profile  Recent Labs Lab 09/22/15 0616  INR 1.11    No results for input(s): DDIMER  in the last 72 hours.  Cardiac Enzymes  Recent Labs Lab 09/21/15 1137  TROPONINI <0.03   ------------------------------------------------------------------------------------------------------------------ Invalid input(s): POCBNP  No results for input(s): GLUCAP in the last 72 hours.   Tuyen Uncapher M.D. PhD [facility] 09/26/2015, 2:47 PM  Pager: [phone]   After 7pm go to [email or web address] - password [email or web address]  Call night coverage person covering after 7pm

## 2015-09-26 NOTE — Progress Notes (Signed)
Removed pt's SCD's. Will be replaced in 1 hour.

## 2015-09-26 NOTE — Anesthesia Postprocedure Evaluation (Signed)
Anesthesia Post Note  Patient: Cartel Tobon  Procedure(s) Performed: Procedure(s) (LRB):  LAPAROSCOPIC CHOLECYSTECTOMY (N/A)  Patient location during evaluation: Nursing Unit Anesthesia Type: General Level of consciousness: awake and alert, oriented and patient cooperative Pain management: pain level controlled Vital Signs Assessment: post-procedure vital signs reviewed and stable Respiratory status: spontaneous breathing Cardiovascular status: blood pressure returned to baseline and stable Postop Assessment: adequate PO intake and no signs of nausea or vomiting Anesthetic complications: no    Last Vitals:  Filed Vitals:   09/25/15 2010 09/26/15 0543  BP: 165/97 157/89  Pulse: 97 86  Temp: 36.9 C 37.1 C  Resp:  20    Last Pain:  Filed Vitals:   09/26/15 0543  PainSc: 0-No pain                 Deriona Altemose J

## 2015-09-27 ENCOUNTER — Encounter (HOSPITAL_COMMUNITY): Payer: Self-pay | Admitting: General Surgery

## 2015-09-27 LAB — CBC
HEMATOCRIT: 42.6 % (ref 39.0–52.0)
Hemoglobin: 13.8 g/dL (ref 13.0–17.0)
MCH: 30.1 pg (ref 26.0–34.0)
MCHC: 32.4 g/dL (ref 30.0–36.0)
MCV: 93 fL (ref 78.0–100.0)
Platelets: 214 10*3/uL (ref 150–400)
RBC: 4.58 MIL/uL (ref 4.22–5.81)
RDW: 13 % (ref 11.5–15.5)
WBC: 10.8 10*3/uL — ABNORMAL HIGH (ref 4.0–10.5)

## 2015-09-27 LAB — HEPATIC FUNCTION PANEL
ALT: 312 U/L — ABNORMAL HIGH (ref 17–63)
AST: 58 U/L — ABNORMAL HIGH (ref 15–41)
Albumin: 3.1 g/dL — ABNORMAL LOW (ref 3.5–5.0)
Alkaline Phosphatase: 101 U/L (ref 38–126)
BILIRUBIN DIRECT: 0.7 mg/dL — AB (ref 0.1–0.5)
BILIRUBIN INDIRECT: 1.5 mg/dL — AB (ref 0.3–0.9)
BILIRUBIN TOTAL: 2.2 mg/dL — AB (ref 0.3–1.2)
Total Protein: 6.5 g/dL (ref 6.5–8.1)

## 2015-09-27 MED ORDER — HYDROCODONE-ACETAMINOPHEN 5-325 MG PO TABS: 1.0000 | ORAL_TABLET | Freq: Four times a day (QID) | ORAL | Status: DC | PRN

## 2015-09-27 NOTE — Progress Notes (Signed)
Yellow/green drainage continues to leak from around JP drain. Abd pads and drain sponges applied. MD called and notified. Will continue to monitor.

## 2015-09-27 NOTE — Progress Notes (Signed)
Rechecked pt's dressings and no yellow or green drainage is present. Will continue to monitor.

## 2015-09-27 NOTE — Care Management Note (Signed)
Case Management Note  Patient Details  Name: Joe KellyLouis Kidd MRN: [id number] Date of Birth: 12/09/1955   Expected Discharge Date:                  Expected Discharge Plan:  Home/Self Care  In-House Referral:  NA  Discharge planning Services  CM Consult  Post Acute Care Choice:  NA Choice offered to:  NA  DME Arranged:    DME Agency:     HH Arranged:    HH Agency:     Status of Service:  Completed, signed off  Medicare Important Message Given:    Date Medicare IM Given:    Medicare IM give by:    Date Additional Medicare IM Given:    Additional Medicare Important Message give by:     If discussed at Long Length of Stay Meetings, dates discussed:    Additional Comments: Pt discharged home today with self care. NO CM needs.  Joe Kidd, Joe Paolillo Demske, RN 09/27/2015, 11:21 AM

## 2015-09-27 NOTE — Progress Notes (Signed)
Patient with orders to be discharge home. Discharge instructions given, patient verbalized understanding. Prescriptions given. Patient stable. Patient left in private vehicle with family.  

## 2015-09-27 NOTE — Discharge Summary (Signed)
Physician Discharge Summary  Patient ID: Joe Kidd MRN: [id number] DOB/AGE: Apr 04, 1956 60 y.o.  Admit date: 09/21/2015 Discharge date: 09/27/2015  Admission Diagnoses: Cholecystitis, cholelithiasis  Discharge Diagnoses: Same, choledocholithiasis, Mirrizzi's syndrome Principal Problem:   Choledocholithiasis Active Problems:   Essential hypertension   Hypertensive urgency   Abdominal pain, right upper quadrant   Transaminitis   GERD (gastroesophageal reflux disease)   Obesity   Abdominal pain   Epigastric pain   Elevated liver enzymes   Elevated LFTs   Biliary stricture   Calculus of gallbladder with biliary obstruction but without cholecystitis   Discharged Condition: good  Hospital Course: Patient is a 60 year old white male who presented the emergency room on 09/21/2015 with worsening right upper quadrant abdominal pain and nausea. CT scan of the abdomen was performed which revealed cholecystitis with cholelithiasis and a mildly dilated common bile duct. His liver enzyme tests were markedly elevated. He also had an elevated bilirubin. He was admitted to the hospital for further evaluation treatment. Ultrasound the gallbladder revealed mildly dilated common bile duct with cholelithiasis seen. While his liver enzyme tests were improving, his bilirubin worsened. Dr. Jena Gauss GI performed an ERCP with stent placement on 09/23/2015. Patient was found to have choledocholithiasis which could not be removed endoscopically, thus the patient had a stent placed. MRCP done around this time also showed a possible extrinsic compression of the common bile duct which was causing the mild ductal dilatation. The patient relates auditory underwent laparoscopic cholecystectomy on 09/25/2015. A thickened gallbladder wall was found and due to significant scarring in the hepatobiliary tree, most of the gallbladder was removed, though a portion of the infundibulum remained. A JP drain was placed. His subsequent  postoperative course has been unremarkable. He had immediate relief of his pain and nausea. His liver enzyme tests as well as bilirubin have been normalizing. He has had some bilious drainage from his JP drain, though that was expected. He is being discharged home on postoperative day 2 in good and improving condition.  Consults: GI and general surgery   Treatments: surgery: ERCP with stent placement on 09/23/2015 Laparoscopic cholecystectomy on 09/25/2015  Discharge Exam: Blood pressure 140/78, pulse 80, temperature 98.2 F (36.8 C), temperature source Oral, resp. rate 18, height  (1.854 m), weight 128.822 kg (284 lb), SpO2 95 %. General appearance: alert, cooperative and no distress Resp: clear to auscultation bilaterally Cardio: regular rate and rhythm, S1, S2 normal, no murmur, click, rub or gallop GI: Soft, incisions healing well. JP drainage serosanguineous was some bile present.  Disposition: Home    Medication List    TAKE these medications        acetaminophen 500 MG tablet  Commonly known as:  TYLENOL  Take 1,000 mg by mouth every 6 (six) hours as needed for mild pain.     calcium carbonate 500 MG chewable tablet  Commonly known as:  TUMS - dosed in mg elemental calcium  Chew 1 tablet by mouth 2 (two) times daily as needed for indigestion or heartburn.     carvedilol 12.5 MG tablet  Commonly known as:  COREG  Take 1 tablet (12.5 mg total) by mouth 2 (two) times daily with a meal.     HYDROcodone-acetaminophen 5-325 MG tablet  Commonly known as:  NORCO/VICODIN  Take 1-2 tablets by mouth every 6 (six) hours as needed for moderate pain.           Follow-up Information    Follow up with Gerrit Halls, NP On 11/05/2015.  Specialty:  Gastroenterology   Why:  at 11:00 am   Contact information:   [address] [place] [address] [phone]       Follow up with Dalia HeadingJENKINS,Aldric Wenzler A, MD On 10/03/2015.   Specialty:  General Surgery   Why:  at 9:45 am   Contact  information:   [address] [place] [address] [phone]       Signed: Franky MachoJENKINS,Ailana Cuadrado A 09/27/2015, 10:57 AM

## 2015-09-27 NOTE — Discharge Instructions (Signed)
Bulb Drain Home Care °A bulb drain consists of a thin rubber tube and a soft, round bulb that creates a gentle suction. The rubber tube is placed in the area where you had surgery. A bulb is attached to the end of the tube that is outside the body. The bulb drain removes excess fluid that normally builds up in a surgical wound after surgery. The color and amount of fluid will vary. Immediately after surgery, the fluid is bright red and is a little thicker than water. It may gradually change to a yellow or pink color and become more thin and water-like. When the amount decreases to about 1 or 2 tbsp in 24 hours, your health care provider will usually remove it. °DAILY CARE °· Keep the bulb flat (compressed) at all times, except while emptying it. The flatness creates suction. You can flatten the bulb by squeezing it firmly in the middle and then closing the cap. °· Keep sites where the tube enters the skin dry and covered with a bandage (dressing). °· Secure the tube 1-2 in (2.5-5.1 cm) below the insertion sites to keep it from pulling on your stitches. The tube is stitched in place and will not slip out. °· Secure the bulb as directed by your health care provider. °· For the first 3 days after surgery, there usually is more fluid in the bulb. Empty the bulb whenever it becomes half full because the bulb does not create enough suction if it is too full. The bulb could also overflow. Write down how much fluid you remove each time you empty your drain. Add up the amount removed in 24 hours. °· Empty the bulb at the same time every day once the amount of fluid decreases and you only need to empty it once a day. Write down the amounts and the 24-hour totals to give to your health care provider. This helps your health care provider know when the tubes can be removed. °EMPTYING THE BULB DRAIN °Before emptying the bulb, get a measuring cup, a piece of paper and a pen, and wash your hands. °· Gently run your fingers down the  tube (stripping) to empty any drainage from the tubing into the bulb. This may need to be done several times a day to clear the tubing of clots and tissue. °· Open the bulb cap to release suction, which causes it to inflate. Do not touch the inside of the cap. °· Gently run your fingers down the tube (stripping) to empty any drainage from the tubing into the bulb. °· Hold the cap out of the way, and pour fluid into the measuring cup.   °· Squeeze the bulb to provide suction.  °· Replace the cap.   °· Check the tape that holds the tube to your skin. If it is becoming loose, you can remove the loose piece of tape and apply a new one. Then, pin the bulb to your shirt.   °· Write down the amount of fluid you emptied out. Write down the date and each time you emptied your bulb drain. (If there are 2 bulbs, note the amount of drainage from each bulb and keep the totals separate. Your health care provider will want to know the total amounts for each drain and which tube is draining more.)   °· Flush the fluid down the toilet and wash your hands.   °· Call your health care provider once you have less than 2 tbsp of fluid collecting in the bulb drain every 24 hours. °If   there is drainage around the tube site, change dressings and keep the area dry. Cleanse around tube with sterile saline and place dry gauze around site. This gauze should be changed when it is soiled. If it stays clean and unsoiled, it should still be changed daily.  °SEEK MEDICAL CARE IF: °· Your drainage has a bad smell or is cloudy.   °· You have a fever.   °· Your drainage is increasing instead of decreasing.   °· Your tube fell out.   °· You have redness or swelling around the tube site.   °· You have drainage from a surgical wound.   °· Your bulb drain will not stay flat after you empty it.   °MAKE SURE YOU:  °· Understand these instructions. °· Will watch your condition. °· Will get help right away if you are not doing well or get worse. °  °This  information is not intended to replace advice given to you by your health care provider. Make sure you discuss any questions you have with your health care provider. °  °Document Released: 07/10/2000 Document Revised: 08/03/2014 Document Reviewed: 01/30/2015 °Elsevier Interactive Patient Education ©2016 [employer] °Laparoscopic Cholecystectomy, Care After °Refer to this sheet in the next few weeks. These instructions provide you with information about caring for yourself after your procedure. Your health care provider may also give you more specific instructions. Your treatment has been planned according to current medical practices, but problems sometimes occur. Call your health care provider if you have any problems or questions after your procedure. °WHAT TO EXPECT AFTER THE PROCEDURE °After your procedure, it is common to have: °· Pain at your incision sites. You will be given pain medicines to control your pain. °· Mild nausea or vomiting. This should improve after the first 24 hours. °· Bloating and possible shoulder pain from the gas that was used during the procedure. This will improve after the first 24 hours. °HOME CARE INSTRUCTIONS °Incision Care °· Follow instructions from your health care provider about how to take care of your incisions. Make sure you: °¨ Wash your hands with soap and water before you change your bandage (dressing). If soap and water are not available, use hand sanitizer. °¨ Change your dressing as told by your health care provider. °¨ Leave stitches (sutures), skin glue, or adhesive strips in place. These skin closures may need to be in place for 2 weeks or longer. If adhesive strip edges start to loosen and curl up, you may trim the loose edges. Do not remove adhesive strips completely unless your health care provider tells you to do that. °· Do not take baths, swim, or use a hot tub until your health care provider approves. Ask your health care provider if you can take showers.  You may only be allowed to take sponge baths for bathing. °General Instructions °· Take over-the-counter and prescription medicines only as told by your health care provider. °· Do not drive or operate heavy machinery while taking prescription pain medicine. °· Return to your normal diet as told by your health care provider. °· Do not lift anything that is heavier than 10 lb (4.5 kg). °· Do not play contact sports for one week or until your health care provider approves. °SEEK MEDICAL CARE IF:  °· You have redness, swelling, or pain at the site of your incision. °· You have fluid, blood, or pus coming from your incision. °· You notice a bad smell coming from your incision area. °· Your surgical incisions break open. °·   You have a fever. °SEEK IMMEDIATE MEDICAL CARE IF: °· You develop a rash. °· You have difficulty breathing. °· You have chest pain. °· You have increasing pain in your shoulders (shoulder strap areas). °· You faint or have dizzy episodes while you are standing. °· You have severe pain in your abdomen. °· You have nausea or vomiting that lasts for more than one day. °  °This information is not intended to replace advice given to you by your health care provider. Make sure you discuss any questions you have with your health care provider. °  °Document Released: 07/13/2005 Document Revised: 04/03/2015 Document Reviewed: 02/22/2013 °Elsevier Interactive Patient Education ©2016 [employer] Inc[employer] ° °

## 2015-09-30 ENCOUNTER — Other Ambulatory Visit: Payer: Self-pay | Admitting: Gastroenterology

## 2015-09-30 ENCOUNTER — Other Ambulatory Visit: Payer: Self-pay

## 2015-09-30 ENCOUNTER — Telehealth: Payer: Self-pay | Admitting: Internal Medicine

## 2015-09-30 DIAGNOSIS — R7989 Other specified abnormal findings of blood chemistry: Secondary | ICD-10-CM

## 2015-09-30 DIAGNOSIS — R945 Abnormal results of liver function studies: Principal | ICD-10-CM

## 2015-09-30 LAB — HEPATIC FUNCTION PANEL
ALK PHOS: 100 U/L (ref 40–115)
ALT: 150 U/L — ABNORMAL HIGH (ref 9–46)
AST: 26 U/L (ref 10–35)
Albumin: 3.8 g/dL (ref 3.6–5.1)
BILIRUBIN DIRECT: 0.6 mg/dL — AB (ref <=0.2)
BILIRUBIN INDIRECT: 1 mg/dL (ref 0.2–1.2)
Total Bilirubin: 1.6 mg/dL — ABNORMAL HIGH (ref 0.2–1.2)
Total Protein: 6.8 g/dL (ref 6.1–8.1)

## 2015-09-30 NOTE — Telephone Encounter (Signed)
pts wife called- he cannot see Dr.Jenkins until Thursday and wanted to know what to do about his labs. Darl PikesSusan advised her  that we would put in the orders for the blood work and he could go to the lab today. Lab orders done.

## 2015-09-30 NOTE — Telephone Encounter (Signed)
I will wait for labs. Thanks!

## 2015-09-30 NOTE — Progress Notes (Signed)
Quick Note:  LFTs improving nicely, near normal. LFTs first week of 10/2015 at least two days prior to ov on 4/11. ______

## 2015-09-30 NOTE — Telephone Encounter (Signed)
See other phone note

## 2015-09-30 NOTE — Telephone Encounter (Signed)
See result note.  

## 2015-09-30 NOTE — Telephone Encounter (Signed)
Pt's wife called to say patient has OV with Dr Lovell SheehanJenkins on Thursday this week and JL is going to fax [facility] to [facility] if patient can go today and have them done. Wife agreed.

## 2015-10-01 ENCOUNTER — Other Ambulatory Visit: Payer: Self-pay | Admitting: Gastroenterology

## 2015-10-01 DIAGNOSIS — R7989 Other specified abnormal findings of blood chemistry: Secondary | ICD-10-CM

## 2015-10-01 DIAGNOSIS — R945 Abnormal results of liver function studies: Principal | ICD-10-CM

## 2015-10-02 ENCOUNTER — Encounter (HOSPITAL_COMMUNITY): Payer: Self-pay | Admitting: Internal Medicine

## 2015-10-17 ENCOUNTER — Ambulatory Visit: Payer: 59 | Admitting: Gastroenterology

## 2015-10-25 ENCOUNTER — Other Ambulatory Visit: Payer: Self-pay

## 2015-10-25 DIAGNOSIS — R945 Abnormal results of liver function studies: Principal | ICD-10-CM

## 2015-10-25 DIAGNOSIS — R7989 Other specified abnormal findings of blood chemistry: Secondary | ICD-10-CM

## 2015-11-04 LAB — HEPATIC FUNCTION PANEL
ALBUMIN: 3.9 g/dL (ref 3.6–5.1)
ALT: 54 U/L — ABNORMAL HIGH (ref 9–46)
AST: 19 U/L (ref 10–35)
Alkaline Phosphatase: 70 U/L (ref 40–115)
BILIRUBIN TOTAL: 0.8 mg/dL (ref 0.2–1.2)
Bilirubin, Direct: 0.2 mg/dL (ref <=0.2)
Indirect Bilirubin: 0.6 mg/dL (ref 0.2–1.2)
Total Protein: 6.5 g/dL (ref 6.1–8.1)

## 2015-11-05 ENCOUNTER — Encounter: Payer: Self-pay | Admitting: Nurse Practitioner

## 2015-11-05 ENCOUNTER — Other Ambulatory Visit: Payer: Self-pay

## 2015-11-05 ENCOUNTER — Ambulatory Visit (INDEPENDENT_AMBULATORY_CARE_PROVIDER_SITE_OTHER): Payer: 59 | Admitting: Nurse Practitioner

## 2015-11-05 VITALS — BP 142/91 | HR 63 | Temp 97.7°F | Ht 73.0 in | Wt 267.0 lb

## 2015-11-05 DIAGNOSIS — R7989 Other specified abnormal findings of blood chemistry: Secondary | ICD-10-CM

## 2015-11-05 DIAGNOSIS — R945 Abnormal results of liver function studies: Secondary | ICD-10-CM

## 2015-11-05 DIAGNOSIS — K8050 Calculus of bile duct without cholangitis or cholecystitis without obstruction: Secondary | ICD-10-CM

## 2015-11-05 NOTE — Progress Notes (Signed)
CC'ED TO PCP 

## 2015-11-05 NOTE — Assessment & Plan Note (Signed)
Liver function tests continue to normalize. All LFTs and liver biomarkers are normal with the exception of ALT which is elevated trivially at 54. Continue to monitor.

## 2015-11-05 NOTE — Progress Notes (Signed)
Referring Provider: Assunta Found, MD Primary Care Physician:  Colette Ribas, MD Primary GI:  Dr.   Chief Complaint  Patient presents with  . Follow-up    HPI:   Joe Kidd is a 60 y.o. male who presents for follow-up and schedule repeat ERCP for stent removal. He was admitted 09/21/2015 for choledocholithiasis and mildly dilated common bile duct. Liver enzymes are elevated as well as bilirubin ERCP with stent placement was completed on 09/23/2015 but common bile duct stone could not be removed due to narrowing common bile duct. MRCP done also showed possible extrinsic compression of the common bile duct causing the mild ductal dilation. Underwent laparoscopic cholecystectomy 09/25/2015 which found a thickened gallbladder wall due to significant scarring of the hepatobiliary tree. After surgery and stent placement bilirubin and liver function tests declined. Arrange for follow-up in 8 weeks to make plans for stent removal. Repeat hepatic function panel completed 11/04/2015 found normal total endorectal bilirubin, indirect bilirubin, alkaline phosphatase, AST. ALT is mildly elevated at 54.   Today he states he's feeling wonderful. Is getting back to normal life, has lost 10 pounds since hospitalization. Exercising daily, improving his diet. Previously drank several cups of coffee and multiple soft drinks dialy, since surgery has not had any, is drinking only water. Denies abdominal pain, N/V, yellowing of skin or eyes, acute episodic confusion, hematochezia, melena. Denies chest pain, dyspnea, dizziness, lightheadedness, syncope, near syncope. Denies any other upper or lower GI symptoms.  Past Medical History  Diagnosis Date  . Hypertension   . Choledocholithiasis     s/p lap cholecyestecomy, ERCP with stent placement    Past Surgical History  Procedure Laterality Date  . Foot foreign body removal Right     as a child  . Cholecystectomy N/A 09/25/2015    Procedure:  LAPAROSCOPIC  CHOLECYSTECTOMY;  Surgeon: Franky Macho, MD;  Location: [facility];  Service: General;  Laterality: N/A;  . Ercp N/A 09/23/2015    Procedure: ENDOSCOPIC RETROGRADE CHOLANGIOPANCREATOGRAPHY (ERCP);  Surgeon: Corbin Ade, MD;  Location: [facility];  Service: Endoscopy;  Laterality: N/A;  . Sphincterotomy N/A 09/23/2015    Procedure: SPHINCTEROTOMY;  Surgeon: Corbin Ade, MD;  Location: [facility];  Service: Endoscopy;  Laterality: N/A;  . Balloon dilation N/A 09/23/2015    Procedure: BALLOON DILATION;  Surgeon: Corbin Ade, MD;  Location: [facility];  Service: Endoscopy;  Laterality: N/A;  . Biliary stent placement N/A 09/23/2015    Procedure: BILIARY STENT PLACEMENT;  Surgeon: Corbin Ade, MD;  Location: [facility];  Service: Endoscopy;  Laterality: N/A;    Current Outpatient Prescriptions  Medication Sig Dispense Refill  . carvedilol (COREG) 12.5 MG tablet Take 1 tablet (12.5 mg total) by mouth 2 (two) times daily with a meal. 60 tablet 0   No current facility-administered medications for this visit.    Allergies as of 11/05/2015 - Review Complete 11/05/2015  Allergen Reaction Noted  . Advil [ibuprofen]  09/21/2015  . Asa [aspirin]  09/21/2015    Family History  Problem Relation Age of Onset  . Colon cancer Neg Hx     Social History   Social History  . Marital Status: Married    Spouse Name: N/A  . Number of Children: N/A  . Years of Education: N/A   Social History Main Topics  . Smoking status: Never Smoker   . Smokeless tobacco: Never Used  . Alcohol Use: No     Comment: previously  occasionally, none sicne 09/2015  . Drug Use: No  . Sexual Activity: Not Asked   Other Topics Concern  . None   Social History Narrative    Review of Systems: General: Negative for anorexia, weight loss, fever, chills, fatigue, weakness. ENT: Negative for hoarseness, difficulty swallowing. CV: Negative for chest pain, angina, palpitations, peripheral edema.    Respiratory: Negative for dyspnea at rest, cough, sputum, wheezing.  GI: See history of present illness. Endo: Negative for unusual weight change.  Heme: Negative for bruising or bleeding.   Physical Exam: BP 142/91 mmHg  Pulse 63  Temp(Src) 97.7 F (36.5 C)  Ht 6\' 1"  (1.854 m)  Wt 267 lb (121.11 kg)  BMI 35.23 kg/m2 General:   Morbidly obese male, alert and oriented. Pleasant and cooperative. Well-nourished and well-developed.  Head:  Normocephalic and atraumatic. Eyes:  Without icterus, sclera clear and conjunctiva pink.  Ears:  Normal auditory acuity. Cardiovascular:  S1, S2 present without murmurs appreciated. Extremities without clubbing or edema. Respiratory:  Clear to auscultation bilaterally. No wheezes, rales, or rhonchi. No distress.  Gastrointestinal:  +BS, obese but soft, non-tender and non-distended. No guarding or rebound. No masses appreciated.  Rectal:  Deferred  Musculoskalatal:  Symmetrical without gross deformities. Skin:  Intact without significant lesions or rashes. Neurologic:  Alert and oriented x4;  grossly normal neurologically. Psych:  Alert and cooperative. Normal mood and affect. Heme/Lymph/Immune: No excessive bruising noted.    11/05/2015 11:45 AM   Disclaimer: This note was dictated with voice recognition software. Similar sounding words can inadvertently be transcribed and may not be corrected upon review.

## 2015-11-05 NOTE — Patient Instructions (Signed)
1. We will schedule your procedure for stent removal for you. 2. Return for follow-up based on echo indications after your procedure.

## 2015-11-05 NOTE — Assessment & Plan Note (Signed)
Status post upper endoscopic cholecystectomy and ERCP with biliary stent placement for a stone to large to remove due to common bile duct stricture. He is now approximately 8 weeks past his ERCP and is due for stent removal. He is doing quite well. Has lost 10 pounds, changed his diet and exercise. States "I feel wonderful." At this point we'll proceed with ERCP for stent removal.  Proceed with outpatient ERCP with biliary stent removal with Dr. Jena Gaussourk in near future: the risks, benefits, and alternatives have been discussed with the patient in detail. The patient states understanding and desires to proceed.  The patient is not on any anticoagulants. In fact, he is only currently on carvedilol for blood pressure.

## 2015-11-06 ENCOUNTER — Other Ambulatory Visit: Payer: Self-pay

## 2015-11-18 ENCOUNTER — Other Ambulatory Visit (HOSPITAL_COMMUNITY): Payer: 59

## 2015-11-18 ENCOUNTER — Encounter (HOSPITAL_COMMUNITY): Payer: Self-pay

## 2015-11-18 NOTE — Patient Instructions (Signed)
Joe Kidd  11/18/2015     @   Your procedure is scheduled on  11/20/2015   Report to [facility] at  1030  A.M.  Call this number if you have problems the morning of surgery:  [phone]   Remember:  Do not eat food or drink liquids after midnight.  Take these medicines the morning of surgery with A SIP OF WATER  Coreg.   Do not wear jewelry, make-up or nail polish.  Do not wear lotions, powders, or perfumes.  You may wear deodorant.  Do not shave 48 hours prior to surgery.  Men may shave face and neck.  Do not bring valuables to the hospital.  [facility] is not responsible for any belongings or valuables.  Contacts, dentures or bridgework may not be worn into surgery.  Leave your suitcase in the car.  After surgery it may be brought to your room.  For patients admitted to the hospital, discharge time will be determined by your treatment team.  Patients discharged the day of surgery will not be allowed to drive home.   Name and phone number of your driver:   family Special instructions:  Follow the diet instructions given to you by Dr Luvenia Starch office.  Please read over the following fact sheets that you were given. Coughing and Deep Breathing, Surgical Site Infection Prevention, Anesthesia Post-op Instructions and Care and Recovery After Surgery      Endoscopic Retrograde Cholangiopancreatography (ERCP) Endoscopic retrograde cholangiopancreatography (ERCP) is a procedure used to diagnosis many diseases of the pancreas, bile ducts, liver, and gallbladder. During ERCP a thin, lighted tube (endoscope) is passed through the mouth and down the back of the throat into the first part of the small intestine (duodenum). A small, plastic tube (cannula) is then passed through the endoscope and directed into the bile duct or pancreatic duct. Dye is then injected through the cannula and X-rays are taken to study the biliary and pancreatic passageways.  LET  [facility] CARE PROVIDER KNOW ABOUT:   Any allergies you have.   All medicines you are taking, including vitamins, herbs, eyedrops, creams, and over-the-counter medicines.   Previous problems you or members of your family have had with the use of anesthetics.   Any blood disorders you have.   Previous surgeries you have had.   Medical conditions you have. RISKS AND COMPLICATIONS Generally, ERCP is a safe procedure. However, as with any procedure, complications can occur. A simple removal of gallstones has the lowest rate of complications. Higher rates of complication occur in people who have poorly functioning bile or pancreatic ducts. Possible complications include:   Pancreatitis.  Bleeding.  Accidental punctures in the bowel wall, pancreas, or gall bladder.  Gall bladder or bile duct infection. BEFORE THE PROCEDURE   Do not eat or drink anything, including water, for at least 8 hours before the procedure or as directed by your health care provider.   Ask your health care provider whether you should stop taking certain medicines prior to your procedure.   Arrange for someone to drive you home. You will not be allowed to drive for 09-81 hours after the procedure. PROCEDURE   You will be given medicine through a vein (intravenously) to make you relaxed and sleepy.   You might have a breathing tube placed to give you medicine that makes you sleep (general anesthetic).   Your throat may be sprayed with medicine that numbs the area  and prevents gagging (local anesthetic), or you may gargle this medicine.   You will lie on your left side.   The endoscope will be inserted through your mouth and into the duodenum. The tube will not interfere with your breathing. Gagging is prevented by the anesthesia.   While X-rays are being taken, you may be positioned on your stomach.   A small sample of tissue (biopsy) may be removed for examination. AFTER THE PROCEDURE    You will rest in bed until you are fully conscious.   When you first wake up, your throat may feel slightly sore.   You will not be allowed to eat or drink until numbness subsides.   Once you are able to drink, urinate, and sit on the edge of the bed without feeling sick to your stomach (nauseous) or dizzy, you may be allowed to go home.   This information is not intended to replace advice given to you by your health care provider. Make sure you discuss any questions you have with your health care provider.   Document Released: 04/07/2001 Document Revised: 05/03/2013 Document Reviewed: 02/21/2013 Elsevier Interactive Patient Education 2016 [employer] Endoscopic Retrograde Cholangiopancreatography (ERCP), Care After Refer to this sheet in the next few weeks. These instructions provide you with information on caring for yourself after your procedure. Your health care provider may also give you more specific instructions. Your treatment has been planned according to current medical practices, but problems sometimes occur. Call your health care provider if you have any problems or questions after your procedure.  WHAT TO EXPECT AFTER THE PROCEDURE  After your procedure, it is typical to feel:   Soreness in your throat.   Sick to your stomach (nauseous).   Bloated.  Dizzy.   Fatigued. HOME CARE INSTRUCTIONS  Have a friend or family member stay with you for the first 24 hours after your procedure.  Start taking your usual medicines and eating normally as soon as you feel well enough to do so or as directed by your health care provider. SEEK MEDICAL CARE IF:  You have abdominal pain.   You develop signs of infection, such as:   Chills.   Feeling unwell.  SEEK IMMEDIATE MEDICAL CARE IF:  You have difficulty swallowing.  You have worsening throat, chest, or abdominal pain.  You vomit.  You have bloody or very black stools.  You have a fever.   This  information is not intended to replace advice given to you by your health care provider. Make sure you discuss any questions you have with your health care provider.   Document Released: 05/03/2013 Document Reviewed: 05/03/2013 Elsevier Interactive Patient Education 2016 Elsevier Inc[employer] PATIENT INSTRUCTIONS POST-ANESTHESIA  IMMEDIATELY FOLLOWING SURGERY:  Do not drive or operate machinery for the first twenty four hours after surgery.  Do not make any important decisions for twenty four hours after surgery or while taking narcotic pain medications or sedatives.  If you develop intractable nausea and vomiting or a severe headache please notify your doctor immediately.  FOLLOW-UP:  Please make an appointment with your surgeon as instructed. You do not need to follow up with anesthesia unless specifically instructed to do so.  WOUND CARE INSTRUCTIONS (if applicable):  Keep a dry clean dressing on the anesthesia/puncture wound site if there is drainage.  Once the wound has quit draining you may leave it open to air.  Generally you should leave the bandage intact for twenty four hours unless there is drainage.  If the epidural site drains for more than 36-48 hours please call the anesthesia department.  QUESTIONS?:  Please feel free to call your physician or the hospital operator if you have any questions, and they will be happy to assist you.

## 2015-11-19 ENCOUNTER — Encounter (HOSPITAL_COMMUNITY)
Admission: RE | Admit: 2015-11-19 | Discharge: 2015-11-19 | Disposition: A | Discharge status: Home / Self Care | Payer: 59 | Source: Ambulatory Visit | Attending: Internal Medicine | Admitting: Internal Medicine

## 2015-11-20 ENCOUNTER — Ambulatory Visit (HOSPITAL_COMMUNITY): Payer: 59

## 2015-11-20 ENCOUNTER — Encounter (HOSPITAL_COMMUNITY)
Admission: RE | Disposition: A | Discharge status: Home / Self Care | Payer: Self-pay | Source: Ambulatory Visit | Attending: Internal Medicine

## 2015-11-20 ENCOUNTER — Ambulatory Visit (HOSPITAL_COMMUNITY): Payer: 59 | Admitting: Anesthesiology

## 2015-11-20 ENCOUNTER — Ambulatory Visit (HOSPITAL_COMMUNITY)
Admission: RE | Admit: 2015-11-20 | Discharge: 2015-11-20 | Disposition: A | Discharge status: Home / Self Care | Payer: 59 | Source: Ambulatory Visit | Attending: Internal Medicine | Admitting: Internal Medicine

## 2015-11-20 ENCOUNTER — Encounter (HOSPITAL_COMMUNITY): Payer: Self-pay | Admitting: *Deleted

## 2015-11-20 DIAGNOSIS — Z79899 Other long term (current) drug therapy: Secondary | ICD-10-CM | POA: Diagnosis not present

## 2015-11-20 DIAGNOSIS — I10 Essential (primary) hypertension: Secondary | ICD-10-CM | POA: Diagnosis not present

## 2015-11-20 DIAGNOSIS — K219 Gastro-esophageal reflux disease without esophagitis: Secondary | ICD-10-CM | POA: Insufficient documentation

## 2015-11-20 DIAGNOSIS — Z4659 Encounter for fitting and adjustment of other gastrointestinal appliance and device: Secondary | ICD-10-CM | POA: Diagnosis not present

## 2015-11-20 DIAGNOSIS — Z9689 Presence of other specified functional implants: Secondary | ICD-10-CM | POA: Diagnosis not present

## 2015-11-20 DIAGNOSIS — K8050 Calculus of bile duct without cholangitis or cholecystitis without obstruction: Secondary | ICD-10-CM | POA: Diagnosis not present

## 2015-11-20 DIAGNOSIS — Z6835 Body mass index (BMI) 35.0-35.9, adult: Secondary | ICD-10-CM | POA: Diagnosis not present

## 2015-11-20 DIAGNOSIS — Z4689 Encounter for fitting and adjustment of other specified devices: Secondary | ICD-10-CM

## 2015-11-20 HISTORY — PX: ENDOSCOPIC RETROGRADE CHOLANGIOPANCREATOGRAPHY (ERCP) WITH PROPOFOL: SHX5810

## 2015-11-20 HISTORY — PX: GASTROINTESTINAL STENT REMOVAL: SHX6384

## 2015-11-20 SURGERY — ENDOSCOPIC RETROGRADE CHOLANGIOPANCREATOGRAPHY (ERCP) WITH PROPOFOL
Anesthesia: General

## 2015-11-20 MED ORDER — FENTANYL CITRATE (PF) 100 MCG/2ML IJ SOLN
INTRAMUSCULAR | Status: AC
  Filled 2015-11-20: qty 2

## 2015-11-20 MED ORDER — MIDAZOLAM HCL 5 MG/5ML IJ SOLN
INTRAMUSCULAR | Status: DC | PRN
  Administered 2015-11-20: 2 mg via INTRAVENOUS

## 2015-11-20 MED ORDER — ONDANSETRON HCL 4 MG/2ML IJ SOLN
INTRAMUSCULAR | Status: AC
Start: 2015-11-20 — End: 2015-11-20
  Filled 2015-11-20: qty 2

## 2015-11-20 MED ORDER — SUCCINYLCHOLINE CHLORIDE 20 MG/ML IJ SOLN
INTRAMUSCULAR | Status: DC | PRN
  Administered 2015-11-20: 120 mg via INTRAVENOUS

## 2015-11-20 MED ORDER — SODIUM CHLORIDE 0.9 % IV SOLN
INTRAVENOUS | Status: DC | PRN
  Administered 2015-11-20: 100 mL

## 2015-11-20 MED ORDER — NEOSTIGMINE METHYLSULFATE 10 MG/10ML IV SOLN
INTRAVENOUS | Status: DC | PRN
  Administered 2015-11-20: 4 mg via INTRAVENOUS

## 2015-11-20 MED ORDER — ONDANSETRON HCL 4 MG/2ML IJ SOLN: 4.0000 mg | Freq: Once | INTRAMUSCULAR | Status: DC | PRN

## 2015-11-20 MED ORDER — GLUCAGON HCL RDNA (DIAGNOSTIC) 1 MG IJ SOLR
INTRAMUSCULAR | Status: AC
  Filled 2015-11-20: qty 1

## 2015-11-20 MED ORDER — LIDOCAINE VISCOUS 2 % MT SOLN
OROMUCOSAL | Status: AC
  Filled 2015-11-20: qty 15

## 2015-11-20 MED ORDER — PROPOFOL 10 MG/ML IV BOLUS
INTRAVENOUS | Status: DC | PRN
  Administered 2015-11-20: 150 mg via INTRAVENOUS

## 2015-11-20 MED ORDER — FENTANYL CITRATE (PF) 100 MCG/2ML IJ SOLN: 25.0000 ug | INTRAMUSCULAR | Status: DC | PRN

## 2015-11-20 MED ORDER — LIDOCAINE HCL 1 % IJ SOLN
INTRAMUSCULAR | Status: DC | PRN
  Administered 2015-11-20: 50 mg via INTRADERMAL

## 2015-11-20 MED ORDER — SODIUM CHLORIDE 0.9 % IV SOLN
INTRAVENOUS | Status: DC
Start: 2015-11-20 — End: 2015-11-20
  Filled 2015-11-20: qty 50

## 2015-11-20 MED ORDER — ROCURONIUM BROMIDE 100 MG/10ML IV SOLN
INTRAVENOUS | Status: DC | PRN
  Administered 2015-11-20: 10 mg via INTRAVENOUS

## 2015-11-20 MED ORDER — GLYCOPYRROLATE 0.2 MG/ML IJ SOLN
INTRAMUSCULAR | Status: DC | PRN
  Administered 2015-11-20: 0.6 mg via INTRAVENOUS

## 2015-11-20 MED ORDER — EPHEDRINE SULFATE 50 MG/ML IJ SOLN
INTRAMUSCULAR | Status: DC | PRN
  Administered 2015-11-20: 10 mg via INTRAVENOUS

## 2015-11-20 MED ORDER — LIDOCAINE HCL (PF) 1 % IJ SOLN
INTRAMUSCULAR | Status: AC
  Filled 2015-11-20: qty 5

## 2015-11-20 MED ORDER — SODIUM CHLORIDE 0.9 % IV SOLN
INTRAVENOUS | Status: AC
  Filled 2015-11-20: qty 100

## 2015-11-20 MED ORDER — FENTANYL CITRATE (PF) 100 MCG/2ML IJ SOLN
25.0000 ug | Freq: Once | INTRAMUSCULAR | Status: AC
  Administered 2015-11-20: 25 ug via INTRAVENOUS

## 2015-11-20 MED ORDER — MIDAZOLAM HCL 2 MG/2ML IJ SOLN
1.0000 mg | INTRAMUSCULAR | Status: DC | PRN
  Administered 2015-11-20: 2 mg via INTRAVENOUS

## 2015-11-20 MED ORDER — MIDAZOLAM HCL 2 MG/2ML IJ SOLN
INTRAMUSCULAR | Status: AC
  Filled 2015-11-20: qty 2

## 2015-11-20 MED ORDER — LACTATED RINGERS IV SOLN
INTRAVENOUS | Status: DC
  Administered 2015-11-20 (×2): via INTRAVENOUS

## 2015-11-20 MED ORDER — SUCCINYLCHOLINE CHLORIDE 20 MG/ML IJ SOLN
INTRAMUSCULAR | Status: AC
  Filled 2015-11-20: qty 1

## 2015-11-20 MED ORDER — FENTANYL CITRATE (PF) 100 MCG/2ML IJ SOLN
INTRAMUSCULAR | Status: DC | PRN
  Administered 2015-11-20: 50 ug via INTRAVENOUS

## 2015-11-20 MED ORDER — ONDANSETRON HCL 4 MG/2ML IJ SOLN
4.0000 mg | Freq: Once | INTRAMUSCULAR | Status: AC
  Administered 2015-11-20: 4 mg via INTRAVENOUS

## 2015-11-20 MED ORDER — LIDOCAINE VISCOUS 2 % MT SOLN
15.0000 mL | Freq: Once | OROMUCOSAL | Status: AC
  Administered 2015-11-20: 15 mL via OROMUCOSAL

## 2015-11-20 NOTE — Anesthesia Postprocedure Evaluation (Signed)
Anesthesia Post Note  Patient: Joe Kidd  Procedure(s) Performed: Procedure(s) (LRB): ENDOSCOPIC RETROGRADE CHOLANGIOPANCREATOGRAPHY (ERCP) WITH PROPOFOL WITH REMOVAL OF STONES (N/A) GASTROINTESTINAL STENT REMOVAL (N/A)  Patient location during evaluation: PACU Anesthesia Type: General Level of consciousness: awake and alert and oriented Pain management: pain level controlled Vital Signs Assessment: post-procedure vital signs reviewed and stable Respiratory status: spontaneous breathing, respiratory function stable and patient connected to face mask oxygen Cardiovascular status: stable Postop Assessment: no signs of nausea or vomiting Anesthetic complications: no    Last Vitals:  Filed Vitals:   11/20/15 1205 11/20/15 1210  BP: 114/70 104/73  Temp:    Resp: 16 36    Last Pain: There were no vitals filed for this visit.               Kymani Shimabukuro A

## 2015-11-20 NOTE — Anesthesia Procedure Notes (Addendum)
Procedure Name: Intubation Date/Time: 11/20/2015 1:43 PM Performed by: Pernell DupreADAMS, AMY A Pre-anesthesia Checklist: Patient identified, Timeout performed, Emergency Drugs available, Patient being monitored and Suction available Patient Re-evaluated:Patient Re-evaluated prior to inductionOxygen Delivery Method: Circle System Utilized Preoxygenation: Pre-oxygenation with 100% oxygen Intubation Type: IV induction, Rapid sequence and Cricoid Pressure applied Laryngoscope Size: 3 and Miller Grade View: Grade II Tube type: Oral Tube size: 7.0 mm Number of attempts: 1 Airway Equipment and Method: Stylet Placement Confirmation: ETT inserted through vocal cords under direct vision,  positive ETCO2 and breath sounds checked- equal and bilateral Secured at: 21 cm Tube secured with: Tape Dental Injury: Teeth and Oropharynx as per pre-operative assessment

## 2015-11-20 NOTE — Interval H&P Note (Signed)
History and Physical Interval Note:  11/20/2015 1:21 PM  Joe Kidd  has presented today for surgery, with the diagnosis of stent removeal/Choledocholithiasis  The various methods of treatment have been discussed with the patient and family. After consideration of risks, benefits and other options for treatment, the patient has consented to  Procedure(s) with comments: ENDOSCOPIC RETROGRADE CHOLANGIOPANCREATOGRAPHY (ERCP) WITH PROPOFOL (N/A) - 730 GASTROINTESTINAL STENT REMOVAL (N/A) as a surgical intervention .  The patient's history has been reviewed, patient examined, no change in status, stable for surgery.  I have reviewed the patient's chart and labs.  Questions were answered to the patient's satisfaction.     [place]  No change; plan for stent removal and stone removal (if still present).  The risks, benefits, limitations, alternatives, and mponderable have been reviewed with the patient. I specifically discussed a1 in 10 chance of pancreatitis, reaction to medications, bleeding, perforation and the possibility of a failed ERCP. Potential for sphincterotomy and stent placement also reviewed. Questions have been answered. All parties agreeable.

## 2015-11-20 NOTE — H&P (View-Only) (Signed)
  Referring Provider: Golding, John, MD Primary Care Physician:  GOLDING, JOHN CABOT, MD Primary GI:  Dr.   Chief Complaint  Patient presents with  . Follow-up    HPI:   Joe Kidd is a 59 y.o. male who presents for follow-up and schedule repeat ERCP for stent removal. He was admitted 09/21/2015 for choledocholithiasis and mildly dilated common bile duct. Liver enzymes are elevated as well as bilirubin ERCP with stent placement was completed on 09/23/2015 but common bile duct stone could not be removed due to narrowing common bile duct. MRCP done also showed possible extrinsic compression of the common bile duct causing the mild ductal dilation. Underwent laparoscopic cholecystectomy 09/25/2015 which found a thickened gallbladder wall due to significant scarring of the hepatobiliary tree. After surgery and stent placement bilirubin and liver function tests declined. Arrange for follow-up in 8 weeks to make plans for stent removal. Repeat hepatic function panel completed 11/04/2015 found normal total endorectal bilirubin, indirect bilirubin, alkaline phosphatase, AST. ALT is mildly elevated at 54.   Today he states he's feeling wonderful. Is getting back to normal life, has lost 10 pounds since hospitalization. Exercising daily, improving his diet. Previously drank several cups of coffee and multiple soft drinks dialy, since surgery has not had any, is drinking only water. Denies abdominal pain, N/V, yellowing of skin or eyes, acute episodic confusion, hematochezia, melena. Denies chest pain, dyspnea, dizziness, lightheadedness, syncope, near syncope. Denies any other upper or lower GI symptoms.  Past Medical History  Diagnosis Date  . Hypertension   . Choledocholithiasis     s/p lap cholecyestecomy, ERCP with stent placement    Past Surgical History  Procedure Laterality Date  . Foot foreign body removal Right     as a child  . Cholecystectomy N/A 09/25/2015    Procedure:  LAPAROSCOPIC  CHOLECYSTECTOMY;  Surgeon: Mark Jenkins, MD;  Location: [facility];  Service: General;  Laterality: N/A;  . Ercp N/A 09/23/2015    Procedure: ENDOSCOPIC RETROGRADE CHOLANGIOPANCREATOGRAPHY (ERCP);  Surgeon: Robert M Rourk, MD;  Location: [facility] SUITE;  Service: Endoscopy;  Laterality: N/A;  . Sphincterotomy N/A 09/23/2015    Procedure: SPHINCTEROTOMY;  Surgeon: Robert M Rourk, MD;  Location: [facility];  Service: Endoscopy;  Laterality: N/A;  . Balloon dilation N/A 09/23/2015    Procedure: BALLOON DILATION;  Surgeon: Robert M Rourk, MD;  Location: [facility];  Service: Endoscopy;  Laterality: N/A;  . Biliary stent placement N/A 09/23/2015    Procedure: BILIARY STENT PLACEMENT;  Surgeon: Robert M Rourk, MD;  Location: [facility];  Service: Endoscopy;  Laterality: N/A;    Current Outpatient Prescriptions  Medication Sig Dispense Refill  . carvedilol (COREG) 12.5 MG tablet Take 1 tablet (12.5 mg total) by mouth 2 (two) times daily with a meal. 60 tablet 0   No current facility-administered medications for this visit.    Allergies as of 11/05/2015 - Review Complete 11/05/2015  Allergen Reaction Noted  . Advil [ibuprofen]  09/21/2015  . Asa [aspirin]  09/21/2015    Family History  Problem Relation Age of Onset  . Colon cancer Neg Hx     Social History   Social History  . Marital Status: Married    Spouse Name: N/A  . Number of Children: N/A  . Years of Education: N/A   Social History Main Topics  . Smoking status: Never Smoker   . Smokeless tobacco: Never Used  . Alcohol Use: No     Comment: previously   occasionally, none sicne 09/2015  . Drug Use: No  . Sexual Activity: Not Asked   Other Topics Concern  . None   Social History Narrative    Review of Systems: General: Negative for anorexia, weight loss, fever, chills, fatigue, weakness. ENT: Negative for hoarseness, difficulty swallowing. CV: Negative for chest pain, angina, palpitations, peripheral edema.    Respiratory: Negative for dyspnea at rest, cough, sputum, wheezing.  GI: See history of present illness. Endo: Negative for unusual weight change.  Heme: Negative for bruising or bleeding.   Physical Exam: BP 142/91 mmHg  Pulse 63  Temp(Src) 97.7 F (36.5 C)  Ht 6' 1" (1.854 m)  Wt 267 lb (121.11 kg)  BMI 35.23 kg/m2 General:   Morbidly obese male, alert and oriented. Pleasant and cooperative. Well-nourished and well-developed.  Head:  Normocephalic and atraumatic. Eyes:  Without icterus, sclera clear and conjunctiva pink.  Ears:  Normal auditory acuity. Cardiovascular:  S1, S2 present without murmurs appreciated. Extremities without clubbing or edema. Respiratory:  Clear to auscultation bilaterally. No wheezes, rales, or rhonchi. No distress.  Gastrointestinal:  +BS, obese but soft, non-tender and non-distended. No guarding or rebound. No masses appreciated.  Rectal:  Deferred  Musculoskalatal:  Symmetrical without gross deformities. Skin:  Intact without significant lesions or rashes. Neurologic:  Alert and oriented x4;  grossly normal neurologically. Psych:  Alert and cooperative. Normal mood and affect. Heme/Lymph/Immune: No excessive bruising noted.    11/05/2015 11:45 AM   Disclaimer: This note was dictated with voice recognition software. Similar sounding words can inadvertently be transcribed and may not be corrected upon review.  

## 2015-11-20 NOTE — Anesthesia Preprocedure Evaluation (Signed)
Anesthesia Evaluation  Patient identified by MRN, date of birth, ID band Patient awake    Reviewed: Allergy & Precautions, NPO status , Patient's Chart, lab work & pertinent test results, reviewed documented beta blocker date and time   Airway Mallampati: II  TM Distance: >3 FB Neck ROM: Full    Dental  (+) Teeth Intact, Dental Advisory Given   Pulmonary    Pulmonary exam normal        Cardiovascular hypertension, Pt. on medications and Pt. on home beta blockers Normal cardiovascular exam     Neuro/Psych    GI/Hepatic GERD  Controlled,  Endo/Other    Renal/GU      Musculoskeletal   Abdominal Normal abdominal exam  (+)   Peds  Hematology   Anesthesia Other Findings   Reproductive/Obstetrics                             Anesthesia Physical Anesthesia Plan  ASA: II  Anesthesia Plan: General   Post-op Pain Management:    Induction: Intravenous, Rapid sequence and Cricoid pressure planned  Airway Management Planned: Oral ETT  Additional Equipment:   Intra-op Plan:   Post-operative Plan: Extubation in OR  Informed Consent: I have reviewed the patients History and Physical, chart, labs and discussed the procedure including the risks, benefits and alternatives for the proposed anesthesia with the patient or authorized representative who has indicated his/her understanding and acceptance.   Dental advisory given  Plan Discussed with: CRNA  Anesthesia Plan Comments:         Anesthesia Quick Evaluation

## 2015-11-20 NOTE — Op Note (Addendum)
[facility] Patient Name: Joe Kidd Procedure Date: 11/20/2015 1:24 PM MRN: [id number] Date of Birth: 1956-04-30 Attending MD: Gennette Pac , MD CSN: [id number] Age: 60 Admit Type: Inpatient Procedure:                ERCP with biliary stent removal and balloon stone                            extraction. Indications:              Bile duct stone(s); stent removal Providers:                Gennette Pac, MD, Jannett Celestine, RN, Birder Robson, Technician Referring MD:              Medicines:                General Anesthesia Complications:            No immediate complications. Total fluoroscopy time                            2 minutes 13 seconds Estimated Blood Loss:     Estimated blood loss was minimal. Estimated blood                            loss was minimal. Procedure:                Pre-Anesthesia Assessment:                           - Prior to the procedure, a History and Physical                            was performed, and patient medications and                            allergies were reviewed. The patient's tolerance of                            previous anesthesia was also reviewed. The risks                            and benefits of the procedure and the sedation                            options and risks were discussed with the patient.                            All questions were answered, and informed consent                            was obtained. Prior Anticoagulants: The patient has  taken no previous anticoagulant or antiplatelet                            agents. After reviewing the risks and benefits, the                            patient was deemed in satisfactory condition to                            undergo the procedure.                           After obtaining informed consent, the scope was                            passed under direct vision. Throughout the                    procedure, the patient's blood pressure, pulse, and                            oxygen saturations were monitored continuously. The                            Endoscope was introduced through the mouth, and                            used to inject contrast into and used to cannulate                            the bile duct. The ERCP was accomplished without                            difficulty. The patient tolerated the procedure                            well. Scope In: 2:07:35 PM Scope Out: 2:28:11 PM Total Procedure Duration: 0 hours 20 minutes 36 seconds  Findings:      Cursory examination of the distal esophagus, stomach and duodenum       revealed no abnormalities aside from that stomach taking a "J-shaped"       configuration.A biliary stent was visible on the scout film. biliary       stent protruding through ampullary orifice. Evidence of prior       sphincterotomy present. One stent was removed intact from the biliary       tree using a snare. Subsequently, scope reintroduced into the second       portion of the duodenum. Using the 0.044 autotome, deep biliary       cannulation with guidewire advancement was easily accomplished.       Cholangiogram performed. 2 filling defects the common hepatic duct were       noted. The biliary tree was not dilated. No stricture appreciated. An       extractor balloon was advanced over the guidewire to the confluence. It       was inflated to accommodate the size the nondilated  common hepatic duct.       A balloon occlusion cholangiogram was performed yielding 2 cholesterol       stones. The biliary tree appeared to drain extremely well after these       maneuvers. The pancreatic duct was not manipulated/injected, etc. Impression:               - One stent was removed from the biliary tree.                           - choledocholithiasis?status post balloon                            extraction. Moderate Sedation:       Moderate (conscious) sedation was personally administered by an       anesthesia professional. The following parameters were monitored: oxygen       saturation, heart rate, blood pressure, respiratory rate, EKG, adequacy       of pulmonary ventilation, and response to care. Total physician       intraservice time was 28 minutes. Recommendation:           Clear liquid diet today. Advance as tolerated                            tomorrow. Hepatic function profile in 2 weeks.                            Office visit in 3 months to discuss first ever                            screening colonoscopy. Procedure Code(s):        --- Professional ---                           [phone], Endoscopic retrograde                            cholangiopancreatography (ERCP); with removal of                            foreign body(s) or stent(s) from biliary/pancreatic                            duct(s) Diagnosis Code(s):        --- Professional ---                           G95.62, Encounter for fitting and adjustment of                            other gastrointestinal appliance and device                           K80.50, Calculus of bile duct without cholangitis                            or cholecystitis without obstruction CPT copyright 2016 [employer] Association.  All rights reserved. The codes documented in this report are preliminary and upon coder review may  be revised to meet current compliance requirements. Gerrit Friendsobert M. Malanie Koloski, MD Gennette Pacobert Michael Phoenicia Pirie, MD 11/20/2015 3:27:06 PM This report has been signed electronically. Number of Addenda: 0

## 2015-11-20 NOTE — Transfer of Care (Signed)
Immediate Anesthesia Transfer of Care Note  Patient: Joe Kidd  Procedure(s) Performed: Procedure(s) with comments: ENDOSCOPIC RETROGRADE CHOLANGIOPANCREATOGRAPHY (ERCP) WITH PROPOFOL WITH REMOVAL OF STONES (N/A) - 730 GASTROINTESTINAL STENT REMOVAL (N/A)  Patient Location: PACU  Anesthesia Type:General  Level of Consciousness: awake, alert , oriented and patient cooperative  Airway & Oxygen Therapy: Patient Spontanous Breathing and Patient connected to face mask oxygen  Post-op Assessment: Report given to RN and Post -op Vital signs reviewed and stable  Post vital signs: Reviewed and stable  Last Vitals:  Filed Vitals:   11/20/15 1205 11/20/15 1210  BP: 114/70 104/73  Temp:    Resp: 16 36    Last Pain: There were no vitals filed for this visit.    Patients Stated Pain Goal: 7 (11/20/15 1031)  Complications: No apparent anesthesia complications

## 2015-11-20 NOTE — Discharge Instructions (Signed)
Standard post-ERCP orders  Clear liquid diet today  Advance diet tomorrow morning as tolerated  Hepatic function profile in 2 weeks  Office visit in 3 months to discuss colon cancer screening

## 2015-11-25 ENCOUNTER — Encounter (HOSPITAL_COMMUNITY): Payer: Self-pay | Admitting: Internal Medicine

## 2015-11-26 ENCOUNTER — Other Ambulatory Visit (HOSPITAL_COMMUNITY): Payer: 59

## 2015-12-10 ENCOUNTER — Encounter: Payer: Self-pay | Admitting: Internal Medicine

## 2015-12-10 ENCOUNTER — Telehealth: Payer: Self-pay | Admitting: Internal Medicine

## 2015-12-10 ENCOUNTER — Other Ambulatory Visit: Payer: Self-pay

## 2015-12-10 ENCOUNTER — Other Ambulatory Visit: Payer: Self-pay | Admitting: Internal Medicine

## 2015-12-10 DIAGNOSIS — R7989 Other specified abnormal findings of blood chemistry: Secondary | ICD-10-CM

## 2015-12-10 DIAGNOSIS — R945 Abnormal results of liver function studies: Principal | ICD-10-CM

## 2015-12-10 NOTE — Telephone Encounter (Signed)
Pt's wife, Hali MarryMindy, called to say that the patient had a procedure on 4/26 and needed to have labs done in 2 weeks, but he hasn't received anything (orders) or where to go to have them done. She said he works out of town and this would be the only week he could do them. Please call to advise [phone]

## 2015-12-10 NOTE — Telephone Encounter (Signed)
I spoke with the pt, the lab orders were from his procedure. I have put the orders in and sent them to the lab. The pt is aware and will have them done tomorrow.

## 2015-12-10 NOTE — Telephone Encounter (Signed)
Pt called this afternoon saying his wife had called earlier today asking about getting his labs done this week.  He would like to go tomorrow. Please call him at [phone] Pt works out of town and is trying to get everything done while he is off.

## 2015-12-10 NOTE — Telephone Encounter (Signed)
See other phone note. I spoke with the pt.  

## 2015-12-11 LAB — HEPATIC FUNCTION PANEL
ALK PHOS: 65 U/L (ref 40–115)
ALT: 30 U/L (ref 9–46)
AST: 15 U/L (ref 10–35)
Albumin: 4 g/dL (ref 3.6–5.1)
BILIRUBIN INDIRECT: 0.5 mg/dL (ref 0.2–1.2)
BILIRUBIN TOTAL: 0.7 mg/dL (ref 0.2–1.2)
Bilirubin, Direct: 0.2 mg/dL (ref <=0.2)
Total Protein: 6.6 g/dL (ref 6.1–8.1)

## 2015-12-16 ENCOUNTER — Other Ambulatory Visit (HOSPITAL_COMMUNITY): Payer: 59

## 2016-03-11 ENCOUNTER — Ambulatory Visit: Payer: 59 | Admitting: Nurse Practitioner

## 2016-09-23 IMAGING — RF DG ERCP WO/W SPHINCTEROTOMY
1 series · 15 of 24 positions shown · non-contrast
Comparison: 09/23/2015

CLINICAL DATA: Biliary stent removal

EXAM:
ERCP
TECHNIQUE: Multiple spot images obtained with the fluoroscopic device and
submitted for interpretation post-procedure.
FLUOROSCOPY TIME:  Radiation Exposure Index (as provided by the
fluoroscopic device): Not available
If the device does not provide the exposure index:
Fluoroscopy Time:  2 minutes 13 seconds
Number of Acquired Images:  9

[Series 1: run · 9 acquisitions, 15 frames shown]
[im 1/9]
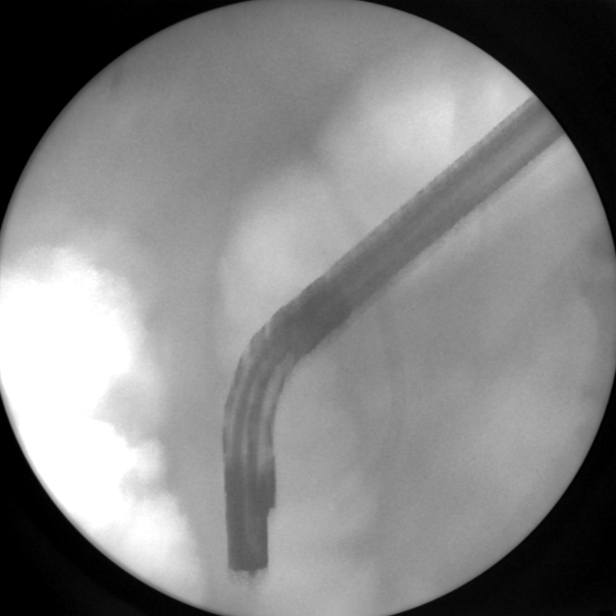
[im 2/9]
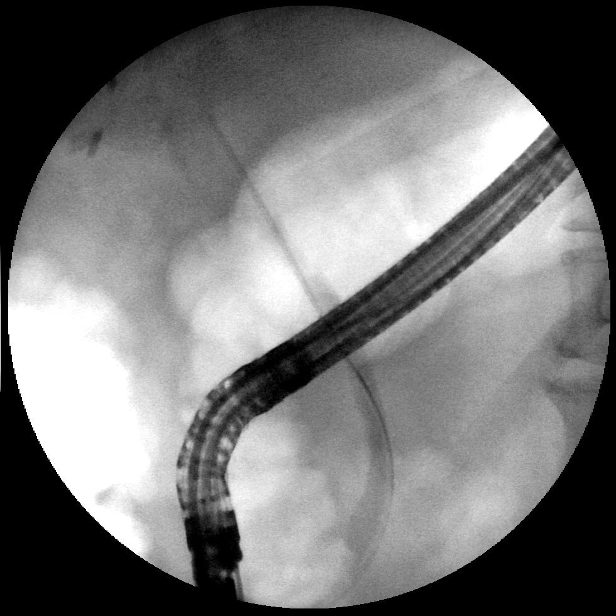
[im 3/9]
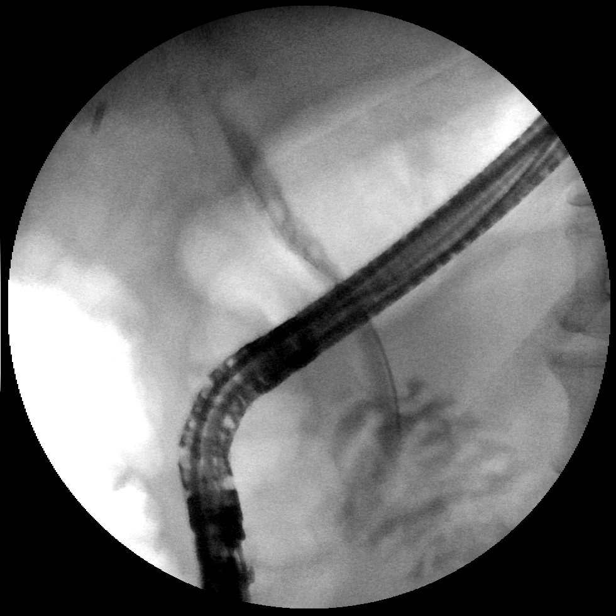
[im 3/9]
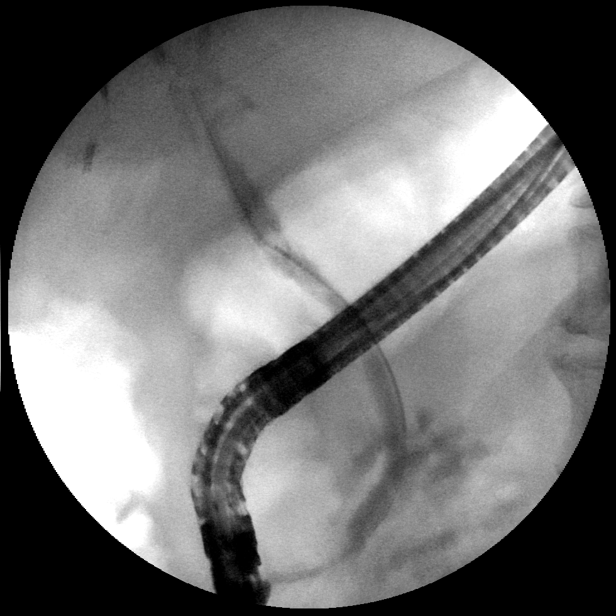
[im 3/9]
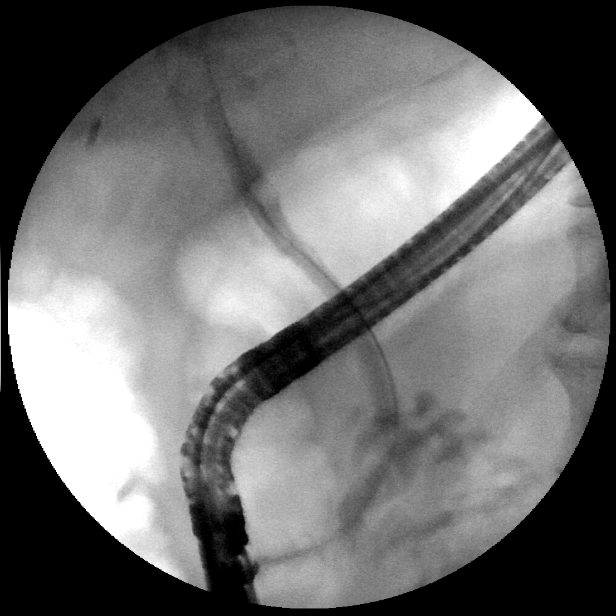
[im 4/9]
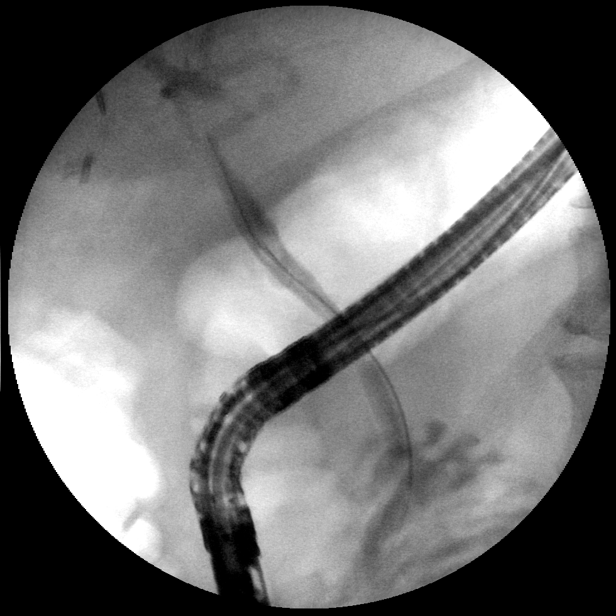
[im 4/9]
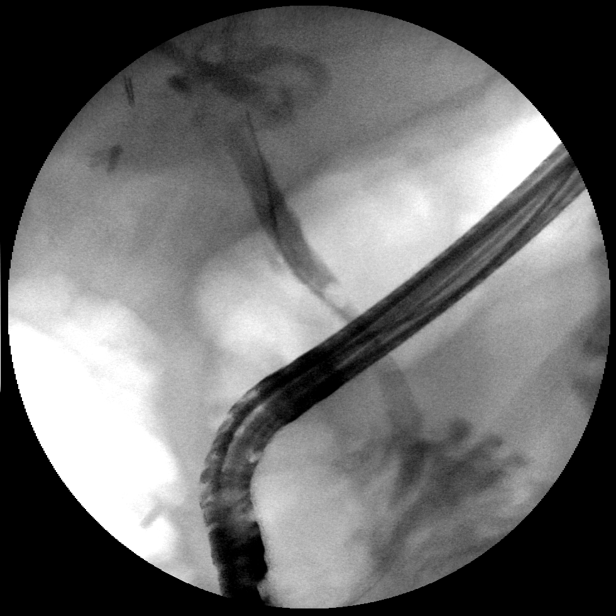
[im 5/9]
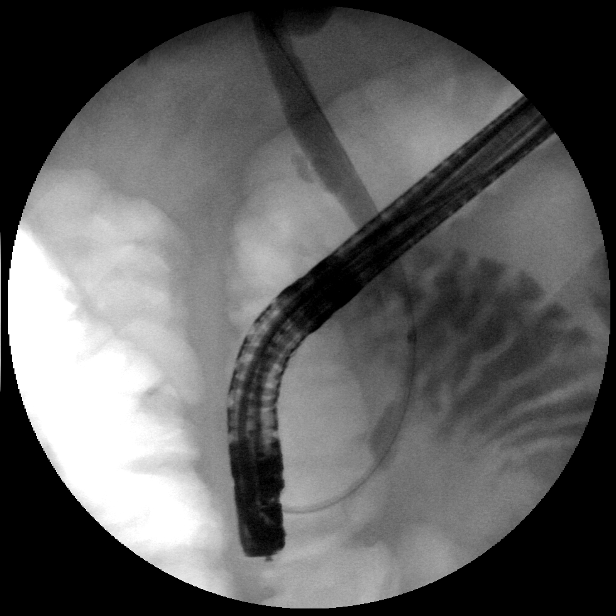
[im 5/9]
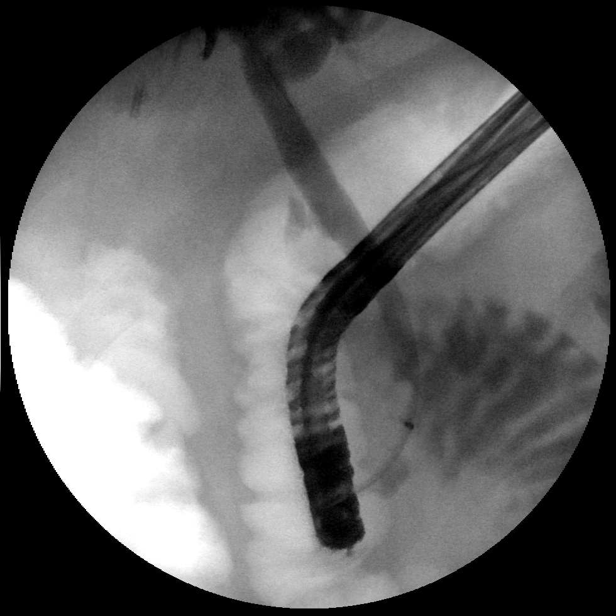
[im 6/9]
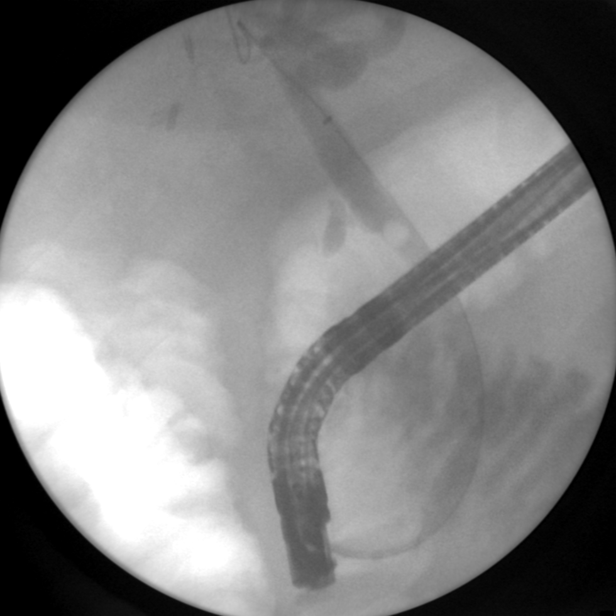
[im 7/9]
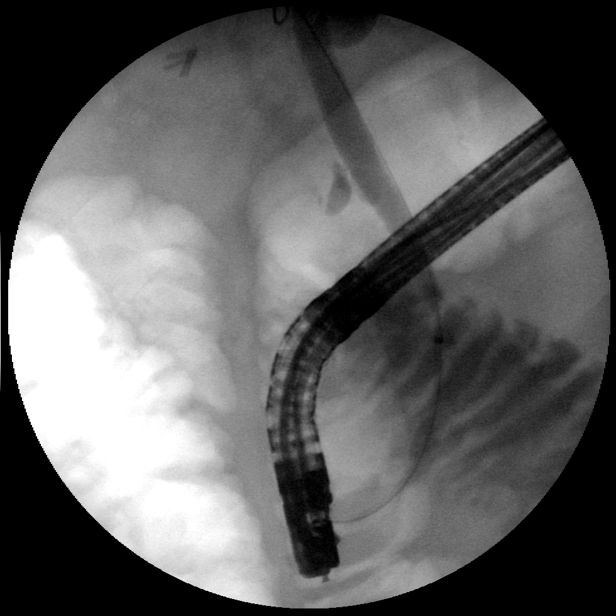
[im 7/9]
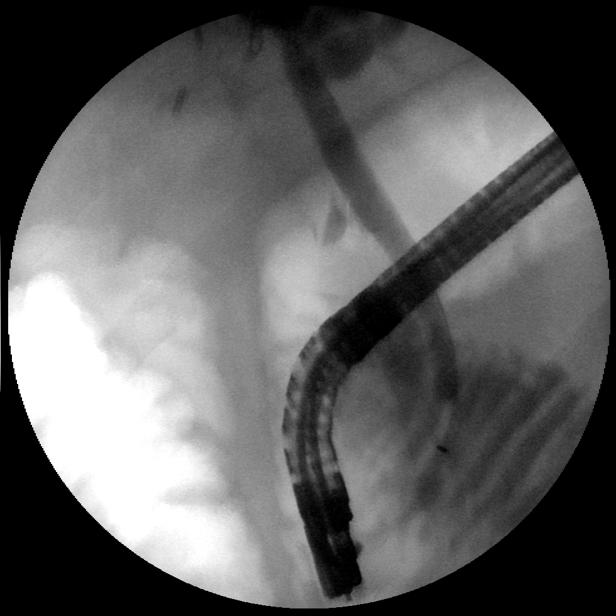
[im 8/9]
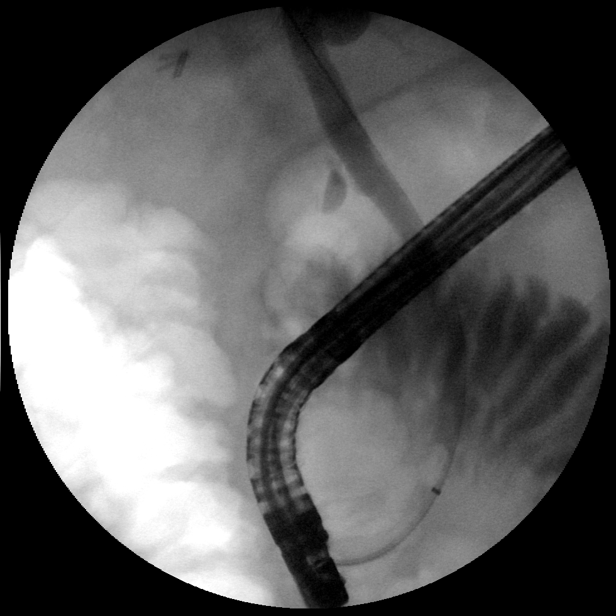
[im 8/9]
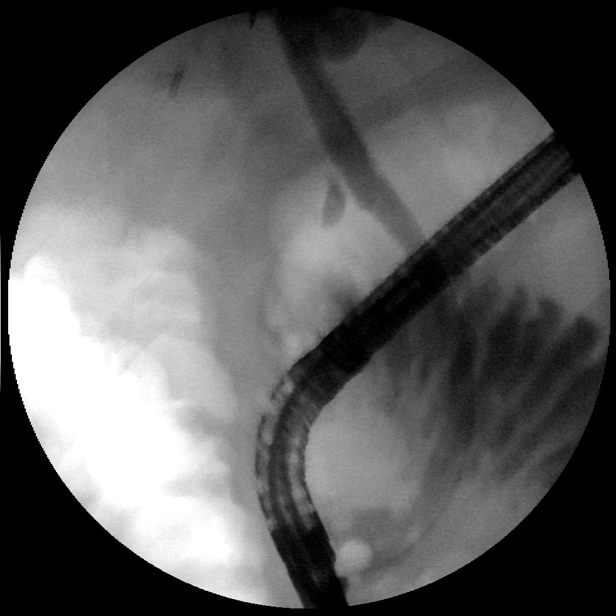
[im 9/9]
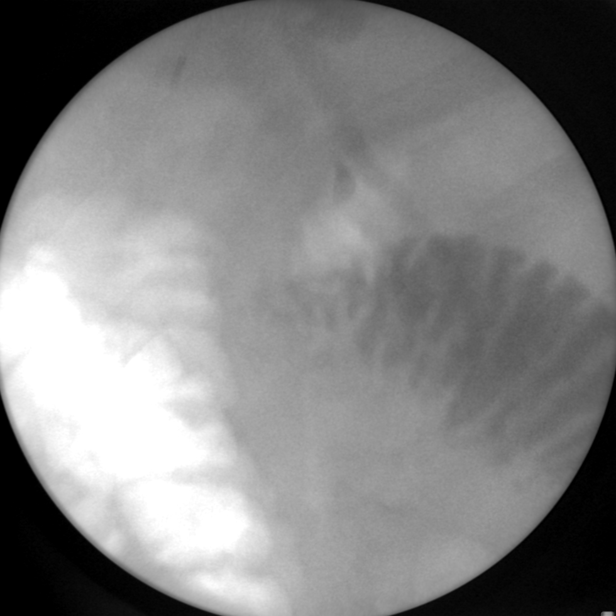

[15 of 24 positions shown; findings below may reference images not displayed]

FINDINGS: Initial image demonstrates a biliary stent in place. This was
subsequently removed. A guidewire was advanced into the common bile
duct. Again multiple filling defects are noted in the distal common
bile duct. These are mobile within the common bile duct. Balloon
sweep of the duct produced 2 stones as per the clinical history.
Followup examination shows no filling defect and adequate flow into
the duodenum.
IMPRESSION: Two common bile duct stones which were removed with balloon sweep.

These images were submitted for radiologic interpretation only.
Please see the procedural report for the amount of contrast and the
fluoroscopy time utilized.

## 2018-05-20 DIAGNOSIS — Z0001 Encounter for general adult medical examination with abnormal findings: Secondary | ICD-10-CM | POA: Diagnosis not present

## 2018-05-20 DIAGNOSIS — E6609 Other obesity due to excess calories: Secondary | ICD-10-CM | POA: Diagnosis not present

## 2018-05-20 DIAGNOSIS — E669 Obesity, unspecified: Secondary | ICD-10-CM | POA: Diagnosis not present

## 2019-12-21 ENCOUNTER — Ambulatory Visit: Payer: 59 | Attending: Internal Medicine

## 2019-12-21 DIAGNOSIS — Z23 Encounter for immunization: Secondary | ICD-10-CM

## 2019-12-21 NOTE — Progress Notes (Signed)
   Covid-[facility] Vaccination [facility]  Name:  Joe Kidd    MRN: [id number] DOB: 10/04/1955  12/21/2019  [name] was observed post Covid-19 immunization for 15 minutes without incident. He was provided with Vaccine Information Sheet and instruction to access the V-Safe system.   [name] was instructed to call 911 with any severe reactions post vaccine: Marland Kitchen Difficulty breathing  . Swelling of face and throat  . A fast heartbeat  . A bad rash all over body  . Dizziness and weakness   Immunizations Administered    Name Date Dose VIS Date Route   Moderna COVID-19 Vaccine 12/21/2019  5:14 PM 0.5 mL 06/2019 Intramuscular   Manufacturer: Moderna   Lot: [id number]   NDC: 84132-440-10

## 2020-01-18 ENCOUNTER — Ambulatory Visit: Payer: 59 | Attending: Internal Medicine

## 2020-01-18 DIAGNOSIS — Z23 Encounter for immunization: Secondary | ICD-10-CM

## 2020-01-18 NOTE — Progress Notes (Signed)
   Covid-19 Vaccination Clinic  Name:  Joe Kidd    MRN: [id number] DOB: 06/09/1956  01/18/2020  [name] was observed post Covid-19 immunization for 30 minutes based on pre-vaccination screening without incident. He was provided with Vaccine Information Sheet and instruction to access the V-Safe system.   [name] was instructed to call 911 with any severe reactions post vaccine: Marland Kitchen Difficulty breathing  . Swelling of face and throat  . A fast heartbeat  . A bad rash all over body  . Dizziness and weakness   Immunizations Administered    Name Date Dose VIS Date Route   Moderna COVID-19 Vaccine 01/18/2020  5:01 PM 0.5 mL 06/2019 Intramuscular   Manufacturer: Moderna   Lot: [id number]   NDC: 73312-508-71
